# Patient Record
Sex: Female | Born: 1950 | Race: White | Hispanic: No | Marital: Married | Smoking: Never smoker
Health system: Southern US, Community
[De-identification: ages and names within clinical notes are randomized; demographics above are authoritative.]

## PROBLEM LIST (undated history)

## (undated) DIAGNOSIS — E119 Type 2 diabetes mellitus without complications: Secondary | ICD-10-CM

## (undated) DIAGNOSIS — I6529 Occlusion and stenosis of unspecified carotid artery: Secondary | ICD-10-CM

## (undated) DIAGNOSIS — I10 Essential (primary) hypertension: Secondary | ICD-10-CM

## (undated) HISTORY — PX: TUBAL LIGATION: SHX77

## (undated) HISTORY — DX: Occlusion and stenosis of unspecified carotid artery: I65.29

## (undated) HISTORY — DX: Essential (primary) hypertension: I10

## (undated) HISTORY — DX: Type 2 diabetes mellitus without complications: E11.9

---

## 2014-02-02 LAB — HM MAMMOGRAPHY

## 2014-06-14 LAB — BASIC METABOLIC PANEL: GLUCOSE: 180 mg/dL

## 2014-06-14 LAB — HEMOGLOBIN A1C: Hemoglobin A1C: 7.9

## 2015-10-04 ENCOUNTER — Ambulatory Visit (INDEPENDENT_AMBULATORY_CARE_PROVIDER_SITE_OTHER): Payer: BLUE CROSS/BLUE SHIELD | Admitting: Physician Assistant

## 2015-10-04 ENCOUNTER — Encounter: Payer: Self-pay | Admitting: Physician Assistant

## 2015-10-04 VITALS — BP 133/82 | HR 95 | Ht 65.0 in | Wt 183.0 lb

## 2015-10-04 DIAGNOSIS — E785 Hyperlipidemia, unspecified: Secondary | ICD-10-CM | POA: Diagnosis not present

## 2015-10-04 DIAGNOSIS — E119 Type 2 diabetes mellitus without complications: Secondary | ICD-10-CM | POA: Insufficient documentation

## 2015-10-04 DIAGNOSIS — I10 Essential (primary) hypertension: Secondary | ICD-10-CM | POA: Diagnosis not present

## 2015-10-04 LAB — POCT GLYCOSYLATED HEMOGLOBIN (HGB A1C): HEMOGLOBIN A1C: 8.2

## 2015-10-04 MED ORDER — CANAGLIFLOZIN-METFORMIN HCL ER 150-1000 MG PO TB24: 1.0000 | ORAL_TABLET | Freq: Every day | ORAL | Status: DC

## 2015-10-04 NOTE — Progress Notes (Signed)
Subjective:    Patient ID: Connie ReamerPatricia Cardenas, female    DOB: 03-Sep-1950, 65 y.o.   MRN: [id number]  HPI Patient is a 65 year old female who presents to the clinic to establish care today.  .. Active Ambulatory Problems    Diagnosis Date Noted  . Hyperlipidemia 10/04/2015  . Type 2 diabetes mellitus without complication, without long-term current use of insulin (HCC) 10/04/2015  . Essential hypertension, benign 10/04/2015   Resolved Ambulatory Problems    Diagnosis Date Noted  . No Resolved Ambulatory Problems   Past Medical History  Diagnosis Date  . Diabetes mellitus without complication (HCC)   . Hypertension    .Marland Kitchen. Family History  Problem Relation Age of Onset  . Cancer Mother     colon  . Hypertension Mother   . Heart attack Father   . Hyperlipidemia Father   . Stroke Paternal Grandmother   . Cancer Sister     non-hodkins lymphoma   .Marland Kitchen. Social History   Social History  . Marital Status: Married    Spouse Name: N/A  . Number of Children: N/A  . Years of Education: N/A   Occupational History  . Not on file.   Social History Main Topics  . Smoking status: Never Smoker   . Smokeless tobacco: Not on file  . Alcohol Use: No  . Drug Use: No  . Sexual Activity: Not Currently   Other Topics Concern  . Not on file   Social History Narrative  . No narrative on file   Patient has diabetes and does not check her sugars regularly. She is only taking metformin twice a day. She admits she has been eating a lot of fast food and not watching her diet. She has also not been exercising. [place] moved here from [place] and just getting established in the area. She denies any open sores or wounds. She denies any hypoglycemic events.  Patient takes lisinopril for blood pressure. She denies any chest pains, palpitations, headaches or dizziness. She does take a daily aspirin.  She does have history of high cholesterol. She is not currently taking any medication for this. She  is resistant to trying statins since her husband has not been able to tolerate these in the past.   Review of Systems  All other systems reviewed and are negative.      Objective:   Physical Exam  Constitutional: She is oriented to person, place, and time. She appears well-developed and well-nourished.  HENT:  Head: Normocephalic and atraumatic.  Cardiovascular: Normal rate, regular rhythm and normal heart sounds.   Pulmonary/Chest: Effort normal and breath sounds normal.  Neurological: [name], place, and time.  Skin: Skin is dry.  Psychiatric: She has a normal mood and affect. Her behavior is normal.          Assessment & Plan:  Diabetes type 2, uncontrolled-A1c today was 8.2. Patient states last A1c was in the sevens. She admits she has not been doing everything she can with her diet and exercise. We will switch to invokamet XR keep to 1 pill but add extra power. Discussed mechanism of action and side effects with patient. She is aware she could have a yeast infection with this medication at the beginning. We will treat according to sleep. We will wait and see what her records show as far as if she's had pneumonia shots. We will also wait on any extra labs at this time. Follow-up in 3 months.  HTN- controlled. Refilled lisinopril.   Hyperlipidemia- will check at next visit.

## 2015-10-04 NOTE — Patient Instructions (Signed)
Canagliflozin; Metformin oral tablets What is this medicine? CANAGLIFLOZIN; METFORMIN (KAN a gli FLOE zin; met FOR min) is a combination of 2 medicines used to treat type 2 diabetes. This medicine lowers blood sugar. Treatment is combined with a balanced diet and exercise. This medicine may be used for other purposes; ask your health care provider or pharmacist if you have questions. What should I tell my health care provider before I take this medicine? They need to know if you have any of these conditions: -anemia -dehydration -diabetic ketoacidosis -diet low in salt -eating less due to illness, surgery, dieting, or any other reason -having surgery -heart disease -high cholesterol -high levels of potassium in the blood -history of pancreatitis or pancreas problems -history of yeast infection of the penis or vagina -if you often drink alcohol -infections in the bladder, kidneys, or urinary tract -kidney disease -liver disease -low blood pressure -on hemodialysis -polycystic ovary syndrome -problems urinating -serious infection or injury -type 1 diabetes -uncircumcised female -vomiting -an unusual or allergic reaction to canagliflozin, metformin, other medicines, foods, dyes, or preservatives -pregnant or trying to get pregnant -breast-feeding How should I use this medicine? Take this medicine by mouth with a glass of water. Take this medicine with food. Follow the directions on the prescription label. Take your doses at regular intervals. Do not take your medicine more often than directed. Do not stop taking except on your doctor's advice. A special MedGuide will be given to you by the pharmacist with each prescription and refill. Be sure to read this information carefully each time. Talk to your pediatrician regarding the use of this medicine in children. Special care may be needed. Overdosage: If you think you have taken too much of this medicine contact a poison control center  or emergency room at once. NOTE: This medicine is only for you. Do not share this medicine with others. What if I miss a dose? If you miss a dose, take it as soon as you can. If it is almost time for your next dose, take only that dose. Do not take double or extra doses. What may interact with this medicine? Do not take this medicine with any of the following medications: -dofetilide -gatifloxacin -certain contrast medicines given before X-rays, CT scans, MRI, or other procedures This medicine may also interact with the following medications: -acetazolamide -alcohol -certain antiviral medicines for HIV infection or hepatitis -cimetidine -crizotinib -digoxin -diuretics -female hormones, like estrogens or progestins and birth control pills -glycopyrrolate -isoniazid -lamotrigine -medicines for blood pressure, heart disease, irregular heart beat -memantine -methazolamide -midodrine -morphine -nicotinic acid -phenobarbital -phenothiazines like chlorpromazine, mesoridazine, prochlorperazine, thioridazine -phenytoin -procainamide -propantheline -quinidine -quinine -ranitidine -ranolazine -rifampin -ritonavir -steroid medicines like prednisone or cortisone -stimulant medicines for attention disorders, weight loss, or to stay awake -thyroid medicines -topiramate -trimethoprim -trospium -vancomycin -vandetanib -zonisamide This list may not describe all possible interactions. Give your health care provider a list of all the medicines, herbs, non-prescription drugs, or dietary supplements you use. Also tell them if you smoke, drink alcohol, or use illegal drugs. Some items may interact with your medicine. What should I watch for while using this medicine? Visit your doctor or health care professional for regular checks on your progress. This medicine can cause a serious condition in which there is too much acid in the blood. If you develop nausea, vomiting, stomach pain,  unusual tiredness, or breathing problems, stop taking this medicine and call your doctor right away. If possible, use a ketone dipstick to check   for ketones in your urine. A test called the HbA1C (A1C) will be monitored. This is a simple blood test. It measures your blood sugar control over the last 2 to 3 months. You will receive this test every 3 to 6 months. Learn how to check your blood sugar. Learn the symptoms of low and high blood sugar and how to manage them. Always carry a quick-source of sugar with you in case you have symptoms of low blood sugar. Examples include hard sugar candy or glucose tablets. Make sure others know that you can choke if you eat or drink when you develop serious symptoms of low blood sugar, such as seizures or unconsciousness. They must get medical help at once. Tell your doctor or health care professional if you have high blood sugar. You might need to change the dose of your medicine. If you are sick or exercising more than usual, you might need to change the dose of your medicine. Do not skip meals. Ask your doctor or health care professional if you should avoid alcohol. Many nonprescription cough and cold products contain sugar or alcohol. These can affect blood sugar. This medicine may cause ovulation in premenopausal women who do not have regular monthly periods. This may increase your chances of becoming pregnant. You should not take this medicine if you become pregnant or think you may be pregnant. Talk with your doctor or health care professional about your birth control options while taking this medicine. Contact your doctor or health care professional right away if think you are pregnant. If you are going to need surgery, a MRI, CT scan, or other procedure, tell your doctor that you are taking this medicine. You may need to stop taking this medicine before the procedure. Wear a medical ID bracelet or chain, and carry a card that describes your disease and details  of your medicine and dosage times. What side effects may I notice from receiving this medicine? Side effects that you should report to your doctor or health care professional as soon as possible: -allergic reactions like skin rash, itching or hives, swelling of the face, lips, or tongue -breathing problems -chest pain -dizziness -feeling faint or lightheaded, falls -muscle aches or pains -muscle weakness -nausea, vomiting, unusual stomach upset or pain -new pain or tenderness, change in skin color, sores or ulcers, or infection in legs or feet -signs and symptoms of low blood sugar such as feeling anxious, confusion, dizziness, increased hunger, unusually weak or tired, sweating, shakiness, cold, irritable, headache, blurred vision, fast heartbeat, loss of consciousness -signs and symptoms of a urinary tract infection, such as fever, chills, a burning feeling when urinating, blood in the urine, back pain -trouble passing urine or change in the amount of urine, including an urgent need to urinate more often, in larger amounts, or at night -penile discharge, itching, or pain in men -slow, irregular heartbeat -unusually tired or weak -vaginal discharge, itching, or odor in women Side effects that usually do not require medical attention (Report these to your doctor or health care professional if they continue or are bothersome.): -constipation -diarrhea -headache -heartburn -mild increase in urination -stomach gas -thirsty This list may not describe all possible side effects. Call your doctor for medical advice about side effects. You may report side effects to FDA at 1-800-FDA-1088. Where should I keep my medicine? Keep out of the reach of children. Store at room temperature between 15 and 30 degrees C (59 and 86 degrees F). Throw away any unused medicine   after the expiration date. NOTE: This sheet is a summary. It may not cover all possible information. If you have questions about this  medicine, talk to your doctor, pharmacist, or health care provider.    2016, [employer]. (2014-09-08 14:34:04)  

## 2015-10-26 ENCOUNTER — Other Ambulatory Visit: Payer: Self-pay | Admitting: Physician Assistant

## 2015-10-27 ENCOUNTER — Other Ambulatory Visit: Payer: Self-pay | Admitting: *Deleted

## 2015-10-27 MED ORDER — LISINOPRIL 20 MG PO TABS: 20.0000 mg | ORAL_TABLET | Freq: Every day | ORAL | Status: DC

## 2015-11-02 ENCOUNTER — Encounter: Payer: Self-pay | Admitting: Physician Assistant

## 2015-11-29 ENCOUNTER — Telehealth: Payer: Self-pay | Admitting: *Deleted

## 2015-11-29 NOTE — Telephone Encounter (Signed)
PA approved through the insurance  Message left on pharm vm and patient vm

## 2015-11-29 NOTE — Telephone Encounter (Signed)
PA submitted for Invokamet through covermymeds [id number] - PA Case ID: [id number]

## 2015-12-08 ENCOUNTER — Encounter: Payer: Self-pay | Admitting: Physician Assistant

## 2015-12-29 ENCOUNTER — Other Ambulatory Visit: Payer: Self-pay | Admitting: *Deleted

## 2015-12-29 ENCOUNTER — Other Ambulatory Visit: Payer: Self-pay | Admitting: Physician Assistant

## 2015-12-29 MED ORDER — LISINOPRIL 20 MG PO TABS: 20.0000 mg | ORAL_TABLET | Freq: Every day | ORAL | 1 refills | Status: DC

## 2016-01-04 ENCOUNTER — Ambulatory Visit (INDEPENDENT_AMBULATORY_CARE_PROVIDER_SITE_OTHER): Payer: BLUE CROSS/BLUE SHIELD | Admitting: Physician Assistant

## 2016-01-04 ENCOUNTER — Encounter: Payer: Self-pay | Admitting: Physician Assistant

## 2016-01-04 VITALS — BP 136/71 | HR 79 | Ht 65.0 in | Wt 184.0 lb

## 2016-01-04 DIAGNOSIS — E119 Type 2 diabetes mellitus without complications: Secondary | ICD-10-CM | POA: Diagnosis not present

## 2016-01-04 DIAGNOSIS — E669 Obesity, unspecified: Secondary | ICD-10-CM | POA: Diagnosis not present

## 2016-01-04 DIAGNOSIS — I10 Essential (primary) hypertension: Secondary | ICD-10-CM | POA: Diagnosis not present

## 2016-01-04 DIAGNOSIS — E66811 Obesity, class 1: Secondary | ICD-10-CM | POA: Insufficient documentation

## 2016-01-04 DIAGNOSIS — E6609 Other obesity due to excess calories: Secondary | ICD-10-CM | POA: Insufficient documentation

## 2016-01-04 LAB — POCT UA - MICROALBUMIN
CREATININE, POC: 100 mg/dL
MICROALBUMIN (UR) POC: 30 mg/L

## 2016-01-04 LAB — POCT GLYCOSYLATED HEMOGLOBIN (HGB A1C): Hemoglobin A1C: 8.5

## 2016-01-04 MED ORDER — CANAGLIFLOZIN-METFORMIN HCL ER 150-1000 MG PO TB24: 1.0000 | ORAL_TABLET | Freq: Every day | ORAL | 2 refills | Status: DC

## 2016-01-04 MED ORDER — LISINOPRIL 20 MG PO TABS: 20.0000 mg | ORAL_TABLET | Freq: Every day | ORAL | 1 refills | Status: DC

## 2016-01-04 NOTE — Progress Notes (Addendum)
Subjective:     Patient ID: Connie ReamerPatricia Cardenas, female   DOB: 08/31/1950, 65 y.o.   MRN: [id number]  HPI Patient is a 65 y.o. caucasian female presenting today for a follow-up on her diabetes type 2. The patient reports that she does not feel as if she is managing her diabetes well at home. She reports that she eats 3 meals daily and some snacks. For breakfast, she states that she usually eats a couple slices of toast and the rest of her meals are composed meats, cheese, and potatoes. The patient notes that she feels like she could really improve her diet as she drinks a lot of soda but states that she does not like artifical sweeteners. The patient states that she did try a "whole thirty diet" for a month and lost 12 lbs. 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 The patient states that she is having some increased in thirst but this is normal for her.Additionally, the patient states that she has diverticulosis and has had one episode of abdominal pain. The patient denies nausea/vomiting, fever, or changes in her bowel movements.   Review of Systems  Constitutional: Negative for activity change, appetite change, chills, diaphoresis, fatigue, fever and unexpected weight change.  HENT: Negative for congestion, ear discharge, ear pain, postnasal drip, rhinorrhea, sinus pressure, sneezing and sore throat.   Eyes: Negative for photophobia, pain, redness, itching and visual disturbance.  Respiratory: Negative for cough, chest tightness, shortness of breath and wheezing.   Cardiovascular: Negative for chest pain, palpitations and leg swelling.  Gastrointestinal: Positive for abdominal pain. Negative for abdominal distention, blood in stool, constipation, diarrhea, nausea and vomiting.  Endocrine: Positive for polydipsia and polyphagia. Negative for cold intolerance, heat intolerance and polyuria.  Genitourinary: Positive for urgency. Negative for  decreased urine volume, difficulty urinating, dysuria, flank pain, frequency, hematuria, vaginal bleeding, vaginal discharge and vaginal pain.  Musculoskeletal: Negative for arthralgias, back pain, joint swelling and myalgias.  Neurological: Negative for dizziness, weakness, light-headedness, numbness and headaches.  Psychiatric/Behavioral: Negative for confusion and decreased concentration. The patient is not nervous/anxious.       Objective:   Physical Exam  Constitutional: She is oriented to person, place, and time. She appears well-developed and well-nourished. No distress.  HENT:  Head: Normocephalic and atraumatic.  Mouth/Throat: No oropharyngeal exudate.  Eyes: Conjunctivae and EOM are normal. Pupils are equal, round, and reactive to light. Right eye exhibits no discharge. Left eye exhibits no discharge. No scleral icterus.  Neck: Normal range of motion. Neck supple. No JVD present. No tracheal deviation present. No thyromegaly present.  Cardiovascular: Normal rate, regular rhythm, normal heart sounds and intact distal pulses.  Exam reveals no gallop and no friction rub.   No murmur heard. Pulmonary/Chest: Effort normal and breath sounds normal. No stridor. No respiratory distress. She has no wheezes. She has no rales. She exhibits no tenderness.  Abdominal: Soft. Bowel sounds are normal. She exhibits no distension and no mass. There is no tenderness. There is no rebound and no guarding.  Musculoskeletal: Normal range of motion. She exhibits no edema, tenderness or deformity.  Inspected patients feet bilaterally revealed no skin changes or breakdown, ulcers, rashes, or concerns of skin integrity. Dorsalis pedis and posterior tibialis pulses +2.   Lymphadenopathy:    She has no cervical adenopathy.  Neurological: [name], place, and time. She has normal reflexes. She displays normal reflexes.  No cranial nerve deficit. She exhibits normal muscle tone.  Coordination normal.  Skin: Skin is warm and dry. No rash noted. She is not diaphoretic. No erythema. No pallor.  Psychiatric: She has a normal mood and affect. Her behavior is normal. Judgment and thought content normal.  Nursing note and vitals reviewed.     Assessment:     [name] was seen today for diabetes.  Diagnoses and all orders for this visit:  Type 2 diabetes mellitus without complication, without long-term current use of insulin (HCC) -     POCT HgB A1C -     POCT UA - Microalbumin  Obesity  Other orders -     lisinopril (PRINIVIL,ZESTRIL) 20 MG tablet; Take 1 tablet (20 mg total) by mouth daily. -     Canagliflozin-Metformin HCl ER (INVOKAMET XR) [phone] MG TB24; Take 1 tablet by mouth daily.      Plan:     1. Type 2 Diabetes Mellitus - Patient to continue taking Invokamet XR [phone] mg 1 tablet once daily. Discussed with patient potentially taking trulicity and was given information regarding this medication. Educated patient on the importance of monitoring her blood sugars regularly. Patient HbA1c was 8.5% and UA was within normal limits. Patient to improve daily dietary regimen and focus decreasing her carbohydrate intake. Mircoalbumin was normal. Foot exam was normal.Patient to return-to-clinic in 3 months for repeat HbA1C and monitoring of her chronic medical conditions.  2. Obesity: Extensively (15 minutes) discussed with patient the need for daily dietary implementation with a focus on portion control and daily caloric deficits. Patient advised to track their daily caloric intake. Patient voices understanding of ideal wt  and agrees with plan.  Patient was given information on the Mediterranean diet and counting carbohydrates.  3. Hypertension - Patient to continue taking lisinopril 20mg  tablets one daily. Patient given prescription refill today. Will continue to monitor.  Summary - Patient to follow-up in 3 months for repeat HbA1C. Patient had a routine HbA1C  and UA performed today.

## 2016-01-04 NOTE — Patient Instructions (Addendum)
Magnesium 500mg  once a day.  Chromion 200-1000mg  divided twice a day.   Basic Carbohydrate Counting for Diabetes Mellitus Carbohydrate counting is a method for keeping track of the amount of carbohydrates you eat. Eating carbohydrates naturally increases the level of sugar (glucose) in your blood, so it is important for you to know the amount that is okay for you to have in every meal. Carbohydrate counting helps keep the level of glucose in your blood within normal limits. The amount of carbohydrates allowed is different for every person. A dietitian can help you calculate the amount that is right for you. Once you know the amount of carbohydrates you can have, you can count the carbohydrates in the foods you want to eat. Carbohydrates are found in the following foods:  Grains, such as breads and cereals.  Dried beans and soy products.  Starchy vegetables, such as potatoes, peas, and corn.  Fruit and fruit juices.  Milk and yogurt.  Sweets and snack foods, such as cake, cookies, candy, chips, soft drinks, and fruit drinks. CARBOHYDRATE COUNTING There are two ways to count the carbohydrates in your food. You can use either of the methods or a combination of both. Reading the "Nutrition Facts" on Packaged Food The "Nutrition Facts" is an area that is included on the labels of almost all packaged food and beverages in the [place]. It includes the serving size of that food or beverage and information about the nutrients in each serving of the food, including the grams (g) of carbohydrate per serving.  Decide the number of servings of this food or beverage that you will be able to eat or drink. Multiply that number of servings by the number of grams of carbohydrate that is listed on the label for that serving. The total will be the amount of carbohydrates you will be having when you eat or drink this food or beverage. Learning Standard Serving Sizes of Food When you eat food that is not  packaged or does not include "Nutrition Facts" on the label, you need to measure the servings in order to count the amount of carbohydrates.A serving of most carbohydrate-rich foods contains about 15 g of carbohydrates. The following list includes serving sizes of carbohydrate-rich foods that provide 15 g ofcarbohydrate per serving:   1 slice of bread (1 oz) or 1 six-inch tortilla.    of a hamburger bun or English muffin.  4-6 crackers.   cup unsweetened dry cereal.    cup hot cereal.   cup rice or pasta.    cup mashed potatoes or  of a large baked potato.  1 cup fresh fruit or one small piece of fruit.    cup canned or frozen fruit or fruit juice.  1 cup milk.   cup plain fat-free yogurt or yogurt sweetened with artificial sweeteners.   cup cooked dried beans or starchy vegetable, such as peas, corn, or potatoes.  Decide the number of standard-size servings that you will eat. Multiply that number of servings by 15 (the grams of carbohydrates in that serving). For example, if you eat 2 cups of strawberries, you will have eaten 2 servings and 30 g of carbohydrates (2 servings x 15 g = 30 g). For foods such as soups and casseroles, in which more than one food is mixed in, you will need to count the carbohydrates in each food that is included. EXAMPLE OF CARBOHYDRATE COUNTING Sample Dinner  3 oz chicken breast.   cup of brown rice.  cup of corn.  1 cup milk.   1 cup strawberries with sugar-free whipped topping.  Carbohydrate Calculation Step 1: Identify the foods that contain carbohydrates:   Rice.   Corn.   Milk.   Strawberries. Step 2:Calculate the number of servings eaten of each:   2 servings of rice.   1 serving of corn.   1 serving of milk.   1 serving of strawberries. Step 3: Multiply each of those number of servings by 15 g:   2 servings of rice x 15 g = 30 g.   1 serving of corn x 15 g = 15 g.   1 serving of milk x 15  g = 15 g.   1 serving of strawberries x 15 g = 15 g. Step 4: Add together all of the amounts to find the total grams of carbohydrates eaten: 30 g + 15 g + 15 g + 15 g = 75 g.   This information is not intended to replace advice given to you by your health care provider. Make sure you discuss any questions you have with your health care provider.   Document Released: 04/09/2005 Document Revised: 04/30/2014 Document Reviewed: 03/06/2013 Elsevier Interactive Patient Education [employer]      Why follow it? Research shows. . Those who follow the Mediterranean diet have a reduced risk of heart disease  . The diet is associated with a reduced incidence of Parkinson's and Alzheimer's diseases . People following the diet may have longer life expectancies and lower rates of chronic diseases  . The Dietary Guidelines for Americans recommends the Mediterranean diet as an eating plan to promote health and prevent disease  What Is the Mediterranean Diet?  . Healthy eating plan based on typical foods and recipes of Mediterranean-style cooking . The diet is primarily a plant based diet; these foods should make up a majority of meals   Starches - Plant based foods should make up a majority of meals - They are an important sources of vitamins, minerals, energy, antioxidants, and fiber - Choose whole grains, foods high in fiber and minimally processed items  - Typical grain sources include wheat, oats, barley, corn, brown rice, bulgar, farro, millet, polenta, couscous  - Various types of beans include chickpeas, lentils, fava beans, black beans, white beans   Fruits  Veggies - Large quantities of antioxidant rich fruits & veggies; 6 or more servings  - Vegetables can be eaten raw or lightly drizzled with oil and cooked  - Vegetables common to the traditional Mediterranean Diet include: artichokes, arugula, beets, broccoli, brussel sprouts, cabbage, carrots, celery, collard greens, cucumbers,  eggplant, kale, leeks, lemons, lettuce, mushrooms, okra, onions, peas, peppers, potatoes, pumpkin, radishes, rutabaga, shallots, spinach, sweet potatoes, turnips, zucchini - Fruits common to the Mediterranean Diet include: apples, apricots, avocados, cherries, clementines, dates, figs, grapefruits, grapes, melons, nectarines, oranges, peaches, pears, pomegranates, strawberries, tangerines  Fats - Replace butter and margarine with healthy oils, such as olive oil, canola oil, and tahini  - Limit nuts to no more than a handful a day  - Nuts include walnuts, almonds, pecans, pistachios, pine nuts  - Limit or avoid candied, honey roasted or heavily salted nuts - Olives are central to the [employer] - can be eaten whole or used in a variety of dishes   Meats Protein - Limiting red meat: no more than a few times a month - When eating red meat: choose lean cuts and keep the portion to the size  of deck of cards - Eggs: approx. 0 to 4 times a week  - Fish and lean poultry: at least 2 a week  - Healthy protein sources include, chicken, [place], lean beef, lamb - Increase intake of seafood such as tuna, salmon, trout, mackerel, shrimp, scallops - Avoid or limit high fat processed meats such as sausage and bacon  Dairy - Include moderate amounts of low fat dairy products  - Focus on healthy dairy such as fat free yogurt, skim milk, low or reduced fat cheese - Limit dairy products higher in fat such as whole or 2% milk, cheese, ice cream  Alcohol - Moderate amounts of red wine is ok  - No more than 5 oz daily for women (all ages) and men older than age 70  - No more than 10 oz of wine daily for men younger than 31  Other - Limit sweets and other desserts  - Use herbs and spices instead of salt to flavor foods  - Herbs and spices common to the traditional Mediterranean Diet include: basil, bay leaves, chives, cloves, cumin, fennel, garlic, lavender, marjoram, mint, oregano, parsley, pepper, rosemary,  sage, savory, sumac, tarragon, thyme   It's not just a diet, it's a lifestyle:  . The Mediterranean diet includes lifestyle factors typical of those in the region  . Foods, drinks and meals are best eaten with others and savored . Daily physical activity is important for overall good health . This could be strenuous exercise like running and aerobics . This could also be more leisurely activities such as walking, housework, yard-work, or taking the stairs . Moderation is the key; a balanced and healthy diet accommodates most foods and drinks . Consider portion sizes and frequency of consumption of certain foods   Meal Ideas & Options:  . Breakfast:  o Whole wheat toast or whole wheat English muffins with peanut butter & hard boiled egg o Steel cut oats topped with apples & cinnamon and skim milk  o Fresh fruit: banana, strawberries, melon, berries, peaches  o Smoothies: strawberries, bananas, greek yogurt, peanut butter o Low fat greek yogurt with blueberries and granola  o Egg white omelet with spinach and mushrooms o Breakfast couscous: whole wheat couscous, apricots, skim milk, cranberries  . Sandwiches:  o Hummus and grilled vegetables (peppers, zucchini, squash) on whole wheat bread   o Grilled chicken on whole wheat pita with lettuce, tomatoes, cucumbers or tzatziki  o Tuna salad on whole wheat bread: tuna salad made with greek yogurt, olives, red peppers, capers, green onions o Garlic rosemary lamb pita: lamb sauted with garlic, rosemary, salt & pepper; add lettuce, cucumber, greek yogurt to pita - flavor with lemon juice and black pepper  . Seafood:  o Mediterranean grilled salmon, seasoned with garlic, basil, parsley, lemon juice and black pepper o Shrimp, lemon, and spinach whole-grain pasta salad made with low fat greek yogurt  o Seared scallops with lemon orzo  o Seared tuna steaks seasoned salt, pepper, coriander topped with tomato mixture of olives, tomatoes, olive oil,  minced garlic, parsley, green onions and cappers  . Meats:  o Herbed greek chicken salad with kalamata olives, cucumber, feta  o Red bell peppers stuffed with spinach, bulgur, lean ground beef (or lentils) & topped with feta   o Kebabs: skewers of chicken, tomatoes, onions, zucchini, squash  o [place] burgers: made with red onions, mint, dill, lemon juice, feta cheese topped with roasted red peppers . Vegetarian o Cucumber salad: cucumbers, artichoke hearts,  celery, red onion, feta cheese, tossed in olive oil & lemon juice  o Hummus and whole grain pita points with a greek salad (lettuce, tomato, feta, olives, cucumbers, red onion) o Lentil soup with celery, carrots made with vegetable broth, garlic, salt and pepper  o Tabouli salad: parsley, bulgur, mint, scallions, cucumbers, tomato, radishes, lemon juice, olive oil, salt and pepper.

## 2016-04-02 ENCOUNTER — Telehealth: Payer: Self-pay

## 2016-04-02 ENCOUNTER — Other Ambulatory Visit: Payer: Self-pay

## 2016-04-02 MED ORDER — FLUCONAZOLE 150 MG PO TABS: 150.0000 mg | ORAL_TABLET | Freq: Once | ORAL | 0 refills | Status: AC

## 2016-04-02 NOTE — Telephone Encounter (Signed)
Ok for diflucan 150mg  take once and repeat if symptoms continue after 48-72 hours. #2 no refills. If patient feels like she continues to have UTI symptoms will need to consider medication change.

## 2016-04-02 NOTE — Telephone Encounter (Signed)
Pt is asking for diflucan. She believes that inokamet is making her have a yeast infection. Please advise.

## 2016-04-02 NOTE — Telephone Encounter (Signed)
Pt.notified

## 2016-04-02 NOTE — Telephone Encounter (Signed)
Rx sent 

## 2016-04-04 ENCOUNTER — Ambulatory Visit: Payer: BLUE CROSS/BLUE SHIELD | Admitting: Physician Assistant

## 2016-04-09 ENCOUNTER — Other Ambulatory Visit: Payer: Self-pay | Admitting: *Deleted

## 2016-04-09 MED ORDER — CANAGLIFLOZIN-METFORMIN HCL ER 150-1000 MG PO TB24: 1.0000 | ORAL_TABLET | Freq: Every day | ORAL | 1 refills | Status: DC

## 2016-04-13 ENCOUNTER — Encounter: Payer: Self-pay | Admitting: Physician Assistant

## 2016-04-13 ENCOUNTER — Ambulatory Visit (INDEPENDENT_AMBULATORY_CARE_PROVIDER_SITE_OTHER): Payer: BLUE CROSS/BLUE SHIELD | Admitting: Physician Assistant

## 2016-04-13 VITALS — BP 116/68 | HR 97 | Ht 65.0 in | Wt 183.0 lb

## 2016-04-13 DIAGNOSIS — E1169 Type 2 diabetes mellitus with other specified complication: Secondary | ICD-10-CM | POA: Diagnosis not present

## 2016-04-13 DIAGNOSIS — E785 Hyperlipidemia, unspecified: Secondary | ICD-10-CM

## 2016-04-13 DIAGNOSIS — E119 Type 2 diabetes mellitus without complications: Secondary | ICD-10-CM | POA: Diagnosis not present

## 2016-04-13 LAB — POCT GLYCOSYLATED HEMOGLOBIN (HGB A1C): Hemoglobin A1C: 10

## 2016-04-13 LAB — POCT URINALYSIS DIPSTICK
Bilirubin, UA: NEGATIVE
Blood, UA: NEGATIVE
Glucose, UA: 100
KETONES UA: NEGATIVE
Nitrite, UA: NEGATIVE
PH UA: 5.5
PROTEIN UA: NEGATIVE
SPEC GRAV UA: 1.015
Urobilinogen, UA: 0.2

## 2016-04-13 MED ORDER — METFORMIN HCL 1000 MG PO TABS: 1000.0000 mg | ORAL_TABLET | Freq: Two times a day (BID) | ORAL | 2 refills | Status: DC

## 2016-04-13 NOTE — Progress Notes (Signed)
   Subjective:    Patient ID: Connie ReamerPatricia Cardenas, female    DOB: 05-15-1950, 65 y.o.   MRN: [id number]  HPI  Pt is a 65 yo female who presents to the clinic for 3 month DM follow up. Pt was started on invokamet at last visit. She has not liked "the way she feels on it". She does feeling like she is keeping some vaginal irritation as well. She is not checking her sugars. She denies any hypoglycemic events. No open sores or wounds. She is walking regularly with her daughter in the morning.     Review of Systems  All other systems reviewed and are negative.      Objective:   Physical Exam  Constitutional: She is oriented to person, place, and time. She appears well-developed and well-nourished.  HENT:  Head: Normocephalic and atraumatic.  Cardiovascular: Normal rate, regular rhythm and normal heart sounds.   Pulmonary/Chest: Effort normal and breath sounds normal.  Neurological: [name], place, and time.  Psychiatric: She has a normal mood and affect. Her behavior is normal.          Assessment & Plan:  Marland Kitchen.Marland Kitchen.Elease Hashimotoatricia was seen today for diabetes.  Diagnoses and all orders for this visit:  Type 2 diabetes mellitus without complication, without long-term current use of insulin (HCC) -     POCT HgB A1C -     POCT urinalysis dipstick -     Lipid panel -     COMPLETE METABOLIC PANEL WITH GFR -     Ambulatory referral to diabetic education  Hyperlipidemia associated with type 2 diabetes mellitus (HCC) -     Lipid panel -     COMPLETE METABOLIC PANEL WITH GFR  Other orders -     metFORMIN (GLUCOPHAGE) 1000 MG tablet; Take 1 tablet (1,000 mg total) by mouth 2 (two) times daily with a meal.   .. Lab Results  Component Value Date   HGBA1C 10.0 04/13/2016   Despite invokamet a1c has increased.  Long discussion with patient.  I strongly advised to start a GLP-1 such as trulicity AND combination of DPP4/metformin. Pt declined despite risk but "will think  about it". Gave information.  She does not want to be on invokament.  Metformin only sent to pharmacy.  Will send for diabetic education.  Call back with at least one medication she would be willing to add to metformin. Lipid panel ordered.    Spent 30 minutes with patient and greater than 50 percent of visit spent counseling patient regarding diabetic control.

## 2016-04-13 NOTE — Patient Instructions (Signed)
Dulaglutide injection What is this medicine? DULAGLUTIDE (DOO la GLOO tide) is used to improve blood sugar control in adults with type 2 diabetes. This medicine may be used with other oral diabetes medicines. COMMON BRAND NAME(S): TRULICITY What should I tell my health care provider before I take this medicine? They need to know if you have any of these conditions: -endocrine tumors (MEN 2) or if someone in your family had these tumors -history of pancreatitis -kidney disease -liver disease -stomach problems -thyroid cancer or if someone in your family had thyroid cancer -an unusual or allergic reaction to dulaglutide, other medicines, foods, dyes, or preservatives -pregnant or trying to get pregnant -breast-feeding How should I use this medicine? This medicine is for injection under the skin of your upper leg (thigh), stomach area, or upper arm. It is usually given once every week (every 7 days). You will be taught how to prepare and give this medicine. Use exactly as directed. Take your medicine at regular intervals. Do not take it more often than directed. If you use this medicine with insulin, you should inject this medicine and the insulin separately. Do not mix them together. Do not give the injections right next to each other. Change (rotate) injection sites with each injection. It is important that you put your used needles and syringes in a special sharps container. Do not put them in a trash can. If you do not have a sharps container, call your pharmacist or healthcare provider to get one. A special MedGuide will be given to you by the pharmacist with each prescription and refill. Be sure to read this information carefully each time. Talk to your pediatrician regarding the use of this medicine in children. Special care may be needed. What if I miss a dose? If you miss a dose, take it as soon as you can within 3 days after the missed dose. Then take your next dose at your regular  weekly time. If it has been longer than 3 days after the missed dose, do not take the missed dose. Take the next dose at your regular time. Do not take double or extra doses. If you have questions about a missed dose, contact your health care provider for advice. What may interact with this medicine? Do not take this medicine with any of the following medications: -gatifloxacin Many medications may cause changes in blood sugar, these include: -alcohol containing beverages -aspirin and aspirin-like drugs -chloramphenicol -chromium -diuretics -female hormones, such as estrogens or progestins, birth control pills -heart medicines -isoniazid -female hormones or anabolic steroids -medications for weight loss -medicines for allergies, asthma, cold, or cough -medicines for mental problems -medicines called MAO inhibitors - Nardil, Parnate, Marplan, Eldepryl -niacin -NSAIDS, such as ibuprofen -pentamidine -phenytoin -probenecid -quinolone antibiotics such as ciprofloxacin, levofloxacin, ofloxacin -some herbal dietary supplements -steroid medicines such as prednisone or cortisone -thyroid hormones Some medications can hide the warning symptoms of low blood sugar (hypoglycemia). You may need to monitor your blood sugar more closely if you are taking one of these medications. These include: -beta-blockers, often used for high blood pressure or heart problems (examples include atenolol, metoprolol, propranolol) -clonidine -guanethidine -reserpine What should I watch for while using this medicine? Visit your doctor or health care professional for regular checks on your progress. A test called the HbA1C (A1C) will be monitored. This is a simple blood test. It measures your blood sugar control over the last 2 to 3 months. You will receive this test every 3 to 6  months. Learn how to check your blood sugar. Learn the symptoms of low and high blood sugar and how to manage them. Always carry a  quick-source of sugar with you in case you have symptoms of low blood sugar. Examples include hard sugar candy or glucose tablets. Make sure others know that you can choke if you eat or drink when you develop serious symptoms of low blood sugar, such as seizures or unconsciousness. They must get medical help at once. Tell your doctor or health care professional if you have high blood sugar. You might need to change the dose of your medicine. If you are sick or exercising more than usual, you might need to change the dose of your medicine. Do not skip meals. Ask your doctor or health care professional if you should avoid alcohol. Many nonprescription cough and cold products contain sugar or alcohol. These can affect blood sugar. Wear a medical ID bracelet or chain, and carry a card that describes your disease and details of your medicine and dosage times. What side effects may I notice from receiving this medicine? Side effects that you should report to your doctor or health care professional as soon as possible: -allergic reactions like skin rash, itching or hives, swelling of the face, lips, or tongue -breathing problems -signs and symptoms of low blood sugar such as feeling anxious, confusion, dizziness, increased hunger, unusually weak or tired, sweating, shakiness, cold, irritable, headache, blurred vision, fast heartbeat, loss of consciousness -unusual stomach upset or pain -vomiting Side effects that usually do not require medical attention (report to your doctor or health care professional if they continue or are bothersome): -diarrhea -heartburn -loss of appetite -nausea -pain, redness, or irritation at site where injected Where should I keep my medicine? Keep out of the reach of children. Store this medicine in a refrigerator between 2 and 8 degrees C (36 and 46 degrees F). Do not freeze or use if the medicine has been frozen. Protect from light and excessive heat. Each single-dose pen or  prefilled syringe can be kept at room temperature, not to exceed 30 degrees C (86 degrees F) for a total of 14 days, if needed. Store in the carton until use. Throw away any unused medicine after the expiration date.  2017 Elsevier/Gold Standard (2015-05-30 14:15:04)

## 2016-04-14 DIAGNOSIS — E785 Hyperlipidemia, unspecified: Secondary | ICD-10-CM

## 2016-04-14 DIAGNOSIS — E1169 Type 2 diabetes mellitus with other specified complication: Secondary | ICD-10-CM | POA: Insufficient documentation

## 2016-05-08 ENCOUNTER — Other Ambulatory Visit: Payer: Self-pay | Admitting: *Deleted

## 2016-05-08 MED ORDER — METFORMIN HCL 1000 MG PO TABS: 1000.0000 mg | ORAL_TABLET | Freq: Two times a day (BID) | ORAL | 0 refills | Status: DC

## 2016-06-28 ENCOUNTER — Other Ambulatory Visit: Payer: Self-pay | Admitting: Physician Assistant

## 2016-09-13 ENCOUNTER — Other Ambulatory Visit: Payer: Self-pay | Admitting: Physician Assistant

## 2016-10-30 ENCOUNTER — Encounter: Payer: Self-pay | Admitting: Osteopathic Medicine

## 2016-10-30 ENCOUNTER — Ambulatory Visit (INDEPENDENT_AMBULATORY_CARE_PROVIDER_SITE_OTHER): Payer: BLUE CROSS/BLUE SHIELD | Admitting: Osteopathic Medicine

## 2016-10-30 VITALS — BP 130/71 | HR 81 | Wt 184.0 lb

## 2016-10-30 DIAGNOSIS — R748 Abnormal levels of other serum enzymes: Secondary | ICD-10-CM | POA: Diagnosis not present

## 2016-10-30 DIAGNOSIS — E119 Type 2 diabetes mellitus without complications: Secondary | ICD-10-CM | POA: Diagnosis not present

## 2016-10-30 DIAGNOSIS — B379 Candidiasis, unspecified: Secondary | ICD-10-CM

## 2016-10-30 LAB — POCT GLYCOSYLATED HEMOGLOBIN (HGB A1C): Hemoglobin A1C: 11.2

## 2016-10-30 MED ORDER — SITAGLIPTIN PHOSPHATE 100 MG PO TABS: 100.0000 mg | ORAL_TABLET | Freq: Every day | ORAL | 1 refills | Status: DC

## 2016-10-30 MED ORDER — FLUCONAZOLE 150 MG PO TABS: 150.0000 mg | ORAL_TABLET | Freq: Once | ORAL | 1 refills | Status: AC

## 2016-10-30 MED ORDER — DAPAGLIFLOZIN PROPANEDIOL 10 MG PO TABS: 10.0000 mg | ORAL_TABLET | Freq: Every day | ORAL | 1 refills | Status: DC

## 2016-10-30 NOTE — Patient Instructions (Addendum)
Plan:  Diabetes  Blood work today to ensure medication safety   Continue Metformin as you're taking it  START NOW Januvia  START LATER Farxiga (2 weeks from now)  Measure fasting blood sugars few times per week: goal 80-120  If blood sugar drops, please let us know!  Plan to recheck with Jade - A1C 3 months from now  Yeast infection  Diflucan as directed   If problem recurs, may need swab

## 2016-10-30 NOTE — Progress Notes (Signed)
HPI: Connie Cardenas is a 66 y.o. female  who [facility], [facility],  for chief complaint of:  Chief Complaint  Patient presents with  . Vaginitis    Yeast infection . Quality & Location: vaginal itching . Duration: few weeks . Modifying factors: Monistat helped but did not resolve the issue.   DM2: Has been resistant to adding new medications. Last seen by PCP 6 months ago, A1C was 10 at that time, she is on only Metformin 1000 bid. (+)polyuria, polydipsia. No weight loss. A1C mildly worse but still measurable.    Past medical history, surgical history, social history and family history reviewed.  Patient Active Problem List   Diagnosis Date Noted  . Hyperlipidemia associated with type 2 diabetes mellitus (HCC) 04/14/2016  . Obesity 01/04/2016  . Hyperlipidemia 10/04/2015  . Type 2 diabetes mellitus without complication, without long-term current use of insulin (HCC) 10/04/2015  . Essential hypertension, benign 10/04/2015    Current medication list and allergy/intolerance information reviewed.   Current Outpatient Prescriptions on File Prior to Visit  Medication Sig Dispense Refill  . aspirin 81 MG tablet Take 81 mg by mouth daily.    Marland Kitchen. lisinopril (PRINIVIL,ZESTRIL) 20 MG tablet TAKE 1 TABLET (20 MG TOTAL) BY MOUTH DAILY. 90 tablet 1  . metFORMIN (GLUCOPHAGE) 1000 MG tablet Take 1 tablet (1,000 mg total) by mouth 2 (two) times daily with a meal. Needs appointment for future refills. 120 tablet 0  . KRILL OIL PO Take by mouth daily.     No current facility-administered medications on file prior to visit.    Allergies  Allergen Reactions  . Penicillins       Review of Systems:  Constitutional: No recent illness  Cardiac: No  chest pain  Respiratory:  No  shortness of breath  Neurologic: No  weakness, No  Dizziness   Exam:  BP 130/71   Pulse 81   Wt 184 lb (83.5 kg)   BMI 30.62 kg/m   Constitutional: VS  see above. General Appearance: alert, well-developed, well-nourished, NAD  Eyes: Normal lids and conjunctive, non-icteric sclera  Ears, Nose, Mouth, Throat: MMM, Normal external inspection ears/nares/mouth/lips/gums.  Neck: No masses, trachea midline.   Respiratory: Normal respiratory effort. no wheeze, no rhonchi, no rales  Cardiovascular: S1/S2 normal, no murmur, no rub/gallop auscultated. RRR.   Musculoskeletal: Gait normal. Symmetric and independent movement of all extremities  Neurological: Normal balance/coordination. No tremor.  Skin: warm, dry, intact.   Psychiatric: Normal judgment/insight. Normal mood and affect. Oriented x3.    Recent Results (from the past 2160 hour(s))  POCT HgB A1C     Status: None   Collection Time: 10/30/16 11:28 AM  Result Value Ref Range   Hemoglobin A1C 11.2        ASSESSMENT/PLAN: Will initiate triple therapy, pt declines injectables at this point. If A1C no better or not at goal at recheck w/ PCP pt Is advised that insulin may be discussed as an option   Type 2 diabetes mellitus without complication, without long-term current use of insulin (HCC) - Plan: fluconazole (DIFLUCAN) 150 MG tablet, POCT HgB A1C, COMPLETE METABOLIC PANEL WITH GFR  Yeast infection    Patient Instructions  Plan:  Diabetes  Blood work today to ensure medication safety   Continue Metformin as you're taking it  START NOW Januvia  START LATER Farxiga (2 weeks from now)  Measure fasting blood sugars few times per week: goal 80-120  If blood  sugar drops, please let us know!  Plan to recheck with Jade - A1C 3 months from now  Yeast infection  Diflucan as directed   If problem recurs, may need swab     Follow-up plan: Return in about 3 months (around 01/30/2017) for recheck diabetes with Lesly Rubenstein, see [place] sooner if needed! .  Visit summary with medication list and pertinent instructions was printed for patient to review, alert [place] if any changes needed.  All questions at time of visit were answered - patient instructed to contact office with any additional concerns. ER/RTC precautions were reviewed with the patient and understanding verbalized.

## 2016-10-31 LAB — COMPLETE METABOLIC PANEL WITH GFR
ALT: 83 U/L — ABNORMAL HIGH (ref 6–29)
AST: 65 U/L — ABNORMAL HIGH (ref 10–35)
Albumin: 4.1 g/dL (ref 3.6–5.1)
Alkaline Phosphatase: 81 U/L (ref 33–130)
BILIRUBIN TOTAL: 0.4 mg/dL (ref 0.2–1.2)
BUN: 12 mg/dL (ref 7–25)
CO2: 26 mmol/L (ref 20–31)
Calcium: 9.3 mg/dL (ref 8.6–10.4)
Chloride: 100 mmol/L (ref 98–110)
Creat: 0.8 mg/dL (ref 0.50–0.99)
GFR, EST NON AFRICAN AMERICAN: 79 mL/min (ref >=60)
Glucose, Bld: 252 mg/dL — ABNORMAL HIGH (ref 65–99)
Potassium: 4.2 mmol/L (ref 3.5–5.3)
Sodium: 137 mmol/L (ref 135–146)
TOTAL PROTEIN: 6.9 g/dL (ref 6.1–8.1)

## 2016-10-31 NOTE — Addendum Note (Signed)
Addended by: Deirdre PippinsALEXANDER, Morene Cecilio M on: 10/31/2016 01:09 PM   Modules accepted: Orders

## 2016-11-02 ENCOUNTER — Encounter: Payer: Self-pay | Admitting: Osteopathic Medicine

## 2016-11-12 ENCOUNTER — Other Ambulatory Visit: Payer: Self-pay | Admitting: Physician Assistant

## 2016-12-23 ENCOUNTER — Other Ambulatory Visit: Payer: Self-pay | Admitting: Physician Assistant

## 2017-01-08 ENCOUNTER — Ambulatory Visit (INDEPENDENT_AMBULATORY_CARE_PROVIDER_SITE_OTHER): Payer: BLUE CROSS/BLUE SHIELD | Admitting: Physician Assistant

## 2017-01-08 ENCOUNTER — Encounter: Payer: Self-pay | Admitting: Physician Assistant

## 2017-01-08 VITALS — BP 123/77 | HR 89 | Ht 65.0 in | Wt 179.0 lb

## 2017-01-08 DIAGNOSIS — E119 Type 2 diabetes mellitus without complications: Secondary | ICD-10-CM | POA: Diagnosis not present

## 2017-01-08 DIAGNOSIS — L298 Other pruritus: Secondary | ICD-10-CM

## 2017-01-08 DIAGNOSIS — N898 Other specified noninflammatory disorders of vagina: Secondary | ICD-10-CM | POA: Diagnosis not present

## 2017-01-08 LAB — WET PREP FOR TRICH, YEAST, CLUE
MICRO NUMBER: 81030178
SPECIMEN QUALITY 3963: ADEQUATE

## 2017-01-08 LAB — POCT GLYCOSYLATED HEMOGLOBIN (HGB A1C): Hemoglobin A1C: 8.9

## 2017-01-08 MED ORDER — FLUCONAZOLE 150 MG PO TABS: 150.0000 mg | ORAL_TABLET | Freq: Once | ORAL | 0 refills | Status: AC

## 2017-01-08 MED ORDER — DULAGLUTIDE 0.75 MG/0.5ML ~~LOC~~ SOAJ: SUBCUTANEOUS | 2 refills | Status: DC

## 2017-01-08 NOTE — Progress Notes (Signed)
   Subjective:    Patient ID: Connie Cardenas, female    DOB: Mar 29, 1951, 66 y.o.   MRN: [id number]  HPI  Pt is a 66 yo female who presents to the clinic to follow up on DM, uncontrolled. She finally decided to start therapy with metformin, januivia and farxiga. She is not checking sugars. She is having frequent what feel like yeast infections. She has been sent dilfucan a few times and she has used OTC monistat. She thinks it could be the medication. She denies any hypoglycemia. No open sores or wounds. She is going on a trip to [place] and would like to make sure she has all the medications she needs while she travels.    .. Active Ambulatory Problems    Diagnosis Date Noted  . Hyperlipidemia 10/04/2015  . Type 2 diabetes mellitus without complication, without long-term current use of insulin (HCC) 10/04/2015  . Essential hypertension, benign 10/04/2015  . Obesity 01/04/2016  . Hyperlipidemia associated with type 2 diabetes mellitus (HCC) 04/14/2016   Resolved Ambulatory Problems    Diagnosis Date Noted  . No Resolved Ambulatory Problems   Past Medical History:  Diagnosis Date  . Diabetes mellitus without complication (HCC)   . Hypertension       Review of Systems  All other systems reviewed and are negative.  See HPI.     Objective:   Physical Exam  Constitutional: She is oriented to person, place, and time. She appears well-developed and well-nourished.  HENT:  Head: Normocephalic and atraumatic.  Cardiovascular: Normal rate, regular rhythm and normal heart sounds.   Pulmonary/Chest: Effort normal and breath sounds normal.  Neurological: [name], place, and time.  Psychiatric: She has a normal mood and affect. Her behavior is normal.          Assessment & Plan:  Marland KitchenMarland KitchenJudianne was seen today for diabetes.  Diagnoses and all orders for this visit:  Type 2 diabetes mellitus without complication, without long-term current use of insulin  (HCC) -     POCT HgB A1C -     Dulaglutide (TRULICITY) 0.75 MG/0.5ML SOPN; Once weekly injection [place] tissue.  Vaginal itching -     fluconazole (DIFLUCAN) 150 MG tablet; Take 1 tablet (150 mg total) by mouth once. Repeat in 48 to 72 hours and 1 week. -     WET PREP FOR TRICH, YEAST, CLUE  Vaginal discharge -     fluconazole (DIFLUCAN) 150 MG tablet; Take 1 tablet (150 mg total) by mouth once. Repeat in 48 to 72 hours and 1 week. -     WET PREP FOR TRICH, YEAST, CLUE   Last a1C was 11.2 .. Lab Results  Component Value Date   HGBA1C 8.9 01/08/2017   Going down.  Stop faraxga.  Start trulicity weekly. Discussed side effects.  Pt declined flu and pneumonia vaccine.  Encouraged annual eye exam.  BP controlled.  Needs labs to evaluate cholesterol.  Follow up in 3 months.  Diabetic diet discussed.   Wet prep ordered.  Diflucan sent.  Will stop GLT-2 and see if discharge and itching improve.

## 2017-01-08 NOTE — Patient Instructions (Signed)
StOP Faraxgia. Start trulicity.

## 2017-01-14 ENCOUNTER — Other Ambulatory Visit: Payer: Self-pay | Admitting: Physician Assistant

## 2017-02-06 ENCOUNTER — Ambulatory Visit: Payer: BLUE CROSS/BLUE SHIELD | Admitting: Physician Assistant

## 2017-02-16 ENCOUNTER — Other Ambulatory Visit: Payer: Self-pay | Admitting: Physician Assistant

## 2017-03-05 ENCOUNTER — Other Ambulatory Visit: Payer: Self-pay | Admitting: Osteopathic Medicine

## 2017-04-01 ENCOUNTER — Other Ambulatory Visit: Payer: Self-pay | Admitting: Physician Assistant

## 2017-04-01 DIAGNOSIS — E119 Type 2 diabetes mellitus without complications: Secondary | ICD-10-CM

## 2017-04-10 ENCOUNTER — Other Ambulatory Visit: Payer: Self-pay | Admitting: Osteopathic Medicine

## 2017-04-12 ENCOUNTER — Ambulatory Visit (INDEPENDENT_AMBULATORY_CARE_PROVIDER_SITE_OTHER): Payer: BLUE CROSS/BLUE SHIELD | Admitting: Physician Assistant

## 2017-04-12 ENCOUNTER — Encounter: Payer: Self-pay | Admitting: Physician Assistant

## 2017-04-12 VITALS — BP 140/65 | HR 90 | Ht 65.0 in | Wt 180.0 lb

## 2017-04-12 DIAGNOSIS — E782 Mixed hyperlipidemia: Secondary | ICD-10-CM

## 2017-04-12 DIAGNOSIS — I10 Essential (primary) hypertension: Secondary | ICD-10-CM

## 2017-04-12 DIAGNOSIS — Z1231 Encounter for screening mammogram for malignant neoplasm of breast: Secondary | ICD-10-CM | POA: Diagnosis not present

## 2017-04-12 DIAGNOSIS — Z1211 Encounter for screening for malignant neoplasm of colon: Secondary | ICD-10-CM

## 2017-04-12 DIAGNOSIS — E119 Type 2 diabetes mellitus without complications: Secondary | ICD-10-CM

## 2017-04-12 DIAGNOSIS — S90222A Contusion of left lesser toe(s) with damage to nail, initial encounter: Secondary | ICD-10-CM

## 2017-04-12 LAB — COMPLETE METABOLIC PANEL WITH GFR
AG RATIO: 1.5 (calc) (ref 1.0–2.5)
ALT: 85 U/L — ABNORMAL HIGH (ref 6–29)
AST: 83 U/L — ABNORMAL HIGH (ref 10–35)
Albumin: 4.1 g/dL (ref 3.6–5.1)
Alkaline phosphatase (APISO): 67 U/L (ref 33–130)
BUN: 13 mg/dL (ref 7–25)
CALCIUM: 9.7 mg/dL (ref 8.6–10.4)
CO2: 26 mmol/L (ref 20–32)
Chloride: 100 mmol/L (ref 98–110)
Creat: 0.79 mg/dL (ref 0.50–0.99)
GFR, EST AFRICAN AMERICAN: 92 mL/min/{1.73_m2} (ref >=60)
GFR, EST NON AFRICAN AMERICAN: 79 mL/min/{1.73_m2} (ref >=60)
GLOBULIN: 2.7 g/dL (ref 1.9–3.7)
Glucose, Bld: 149 mg/dL — ABNORMAL HIGH (ref 65–99)
POTASSIUM: 4.1 mmol/L (ref 3.5–5.3)
SODIUM: 138 mmol/L (ref 135–146)
TOTAL PROTEIN: 6.8 g/dL (ref 6.1–8.1)
Total Bilirubin: 0.5 mg/dL (ref 0.2–1.2)

## 2017-04-12 LAB — LIPID PANEL W/REFLEX DIRECT LDL
CHOL/HDL RATIO: 5.4 (calc) — AB (ref <5.0)
CHOLESTEROL: 259 mg/dL — AB (ref <200)
HDL: 48 mg/dL — AB (ref >50)
LDL Cholesterol (Calc): 175 mg/dL (calc) — ABNORMAL HIGH
Non-HDL Cholesterol (Calc): 211 mg/dL (calc) — ABNORMAL HIGH (ref <130)
Triglycerides: 203 mg/dL — ABNORMAL HIGH (ref <150)

## 2017-04-12 LAB — POCT GLYCOSYLATED HEMOGLOBIN (HGB A1C): HEMOGLOBIN A1C: 8.8

## 2017-04-12 MED ORDER — DULAGLUTIDE 1.5 MG/0.5ML ~~LOC~~ SOAJ: 1.5000 mg | SUBCUTANEOUS | 2 refills | Status: DC

## 2017-04-12 MED ORDER — SITAGLIPTIN PHOSPHATE 100 MG PO TABS: 100.0000 mg | ORAL_TABLET | Freq: Every day | ORAL | 1 refills | Status: DC

## 2017-04-12 MED ORDER — LISINOPRIL 20 MG PO TABS: 20.0000 mg | ORAL_TABLET | Freq: Every day | ORAL | 1 refills | Status: DC

## 2017-04-12 MED ORDER — METFORMIN HCL 1000 MG PO TABS: 1000.0000 mg | ORAL_TABLET | Freq: Two times a day (BID) | ORAL | 1 refills | Status: DC

## 2017-04-12 NOTE — Patient Instructions (Signed)

## 2017-04-12 NOTE — Progress Notes (Signed)
Subjective:    Patient ID: Connie ReamerPatricia Cardenas, female    DOB: Dec 11, 1950, 66 y.o.   MRN: [id number]  HPI  Pt is a 66 yo female with DM, HTN, Hyperlipidemia who presents to the clinic for 3 month follow up and medication refill.   DM- denies any hypoglycemia. She is checking her sugars and ranging 130 to 180's in am. No open sores or wounds. She is taking trulcity but has been out of metformin and actos for at least 1 month. She is not on any diet or exercise plan. She has been traveling a lot lately.   She has notice a small dark spot on left great toe for about a month. No pain. No tenderness. Does not remember injury.   .. Active Ambulatory Problems    Diagnosis Date Noted  . Hyperlipidemia 10/04/2015  . Type 2 diabetes mellitus without complication, without long-term current use of insulin (HCC) 10/04/2015  . Essential hypertension, benign 10/04/2015  . Obesity 01/04/2016  . Hyperlipidemia associated with type 2 diabetes mellitus (HCC) 04/14/2016   Resolved Ambulatory Problems    Diagnosis Date Noted  . No Resolved Ambulatory Problems   Past Medical History:  Diagnosis Date  . Diabetes mellitus without complication (HCC)   . Hypertension    .Connie Cardenas. Family History  Problem Relation Age of Onset  . Cancer Mother        colon  . Hypertension Mother   . Heart attack Father   . Hyperlipidemia Father   . Stroke Paternal Grandmother   . Cancer Sister        non-hodkins lymphoma     Review of Systems  All other systems reviewed and are negative.      Objective:   Physical Exam  Constitutional: She is oriented to person, place, and time. She appears well-developed and well-nourished.  HENT:  Head: Normocephalic and atraumatic.  Neck: Normal range of motion. Neck supple.  Cardiovascular: Normal rate and regular rhythm.  3/6 SEM.   Pulmonary/Chest: Effort normal and breath sounds normal.  Musculoskeletal:       Feet:  Lymphadenopathy:    She has no cervical  adenopathy.  Neurological: [name], place, and time.  Psychiatric: She has a normal mood and affect. Her behavior is normal.          Assessment & Plan:  Connie Cardenas.Connie Cardenas.Elease Hashimotoatricia was seen today for diabetes.  Diagnoses and all orders for this visit:  Type 2 diabetes mellitus without complication, without long-term current use of insulin (HCC) -     POCT HgB A1C -     metFORMIN (GLUCOPHAGE) 1000 MG tablet; Take 1 tablet (1,000 mg total) by mouth 2 (two) times daily. -     sitaGLIPtin (JANUVIA) 100 MG tablet; Take 1 tablet (100 mg total) by mouth daily. -     Dulaglutide (TRULICITY) 1.5 MG/0.5ML SOPN; Inject 1.5 mg into the skin once a week. -     Lipid Panel w/reflex Direct LDL -     COMPLETE METABOLIC PANEL WITH GFR  Visit for screening mammogram -     MM SCREENING BREAST TOMO BILATERAL  Toenail bruise, left, initial encounter  Mixed hyperlipidemia -     Lipid Panel w/reflex Direct LDL  Colon cancer screening -     Ambulatory referral to Gastroenterology  Essential hypertension -     lisinopril (PRINIVIL,ZESTRIL) 20 MG tablet; Take 1 tablet (20 mg total) by mouth daily.   DM- .Connie Cardenas. Lab Results  Component Value Date   HGBA1C 8.8 04/12/2017  only down .1 from 3 months ago.  Increase trulicity to 1.5.  Refilled actos and metformin.  Discussed diabetic diet.  On ACE.  On ASA. Checking lipids today.  Needs eye exam.   .. Diabetic Foot Exam - Simple   Simple Foot Form Visual Inspection No deformities, no ulcerations, no other skin breakdown bilaterally:  Yes Sensation Testing Intact to touch and monofilament testing bilaterally:  Yes Pulse Check Posterior Tibialis and Dorsalis pulse intact bilaterally:  Yes Comments      Follow up in 3 months.   Discussed with patient dark spot on great toe appears to be a toenail bruise. Should slowly resolve.  Follow up if getting bigger or any redness or tenderness occurs. Certainly can make a podiatry  referral if needed.

## 2017-04-17 NOTE — Progress Notes (Signed)
As long as she is aware of risk. 6 month follow up is ok.

## 2017-05-14 DIAGNOSIS — H5213 Myopia, bilateral: Secondary | ICD-10-CM | POA: Diagnosis not present

## 2017-05-14 DIAGNOSIS — E119 Type 2 diabetes mellitus without complications: Secondary | ICD-10-CM | POA: Diagnosis not present

## 2017-05-14 LAB — HM DIABETES EYE EXAM

## 2017-05-23 ENCOUNTER — Encounter: Payer: Self-pay | Admitting: Physician Assistant

## 2017-06-26 DIAGNOSIS — Z1211 Encounter for screening for malignant neoplasm of colon: Secondary | ICD-10-CM | POA: Diagnosis not present

## 2017-06-26 DIAGNOSIS — K5730 Diverticulosis of large intestine without perforation or abscess without bleeding: Secondary | ICD-10-CM | POA: Diagnosis not present

## 2017-06-26 DIAGNOSIS — K640 First degree hemorrhoids: Secondary | ICD-10-CM | POA: Diagnosis not present

## 2017-06-26 LAB — HM COLONOSCOPY

## 2017-07-03 ENCOUNTER — Encounter: Payer: Self-pay | Admitting: Physician Assistant

## 2017-07-15 ENCOUNTER — Encounter: Payer: Self-pay | Admitting: Physician Assistant

## 2017-07-15 ENCOUNTER — Ambulatory Visit (INDEPENDENT_AMBULATORY_CARE_PROVIDER_SITE_OTHER): Payer: BLUE CROSS/BLUE SHIELD | Admitting: Physician Assistant

## 2017-07-15 VITALS — BP 132/72 | HR 90 | Ht 65.0 in | Wt 182.0 lb

## 2017-07-15 DIAGNOSIS — R0989 Other specified symptoms and signs involving the circulatory and respiratory systems: Secondary | ICD-10-CM | POA: Diagnosis not present

## 2017-07-15 DIAGNOSIS — E782 Mixed hyperlipidemia: Secondary | ICD-10-CM | POA: Diagnosis not present

## 2017-07-15 DIAGNOSIS — R011 Cardiac murmur, unspecified: Secondary | ICD-10-CM | POA: Diagnosis not present

## 2017-07-15 DIAGNOSIS — R748 Abnormal levels of other serum enzymes: Secondary | ICD-10-CM | POA: Diagnosis not present

## 2017-07-15 DIAGNOSIS — E119 Type 2 diabetes mellitus without complications: Secondary | ICD-10-CM

## 2017-07-15 LAB — POCT GLYCOSYLATED HEMOGLOBIN (HGB A1C): Hemoglobin A1C: 8.3

## 2017-07-15 NOTE — Patient Instructions (Signed)
welchol repatha zetia

## 2017-07-15 NOTE — Progress Notes (Addendum)
Subjective:    Patient ID: Connie Cardenas, female    DOB: 1950/06/03, 67 y.o.   MRN: [id number]  HPI Kenijah is a 67 year old female with a history of type 2 diabetes, HTN, and hyperlipidemia who presents for a 3 month follow up and medication refill.   She denies any complaints at this time including headaches, chest pain, visual changes, weakness, ulcerations. She has been taking her medications as prescribed. She takes trulicity/metformin/januvia. She states she has been working on her diet and has been attempting to cut out some unhealthy snacking throughout the day. She has also started to do intermittent fasting in hopes to lose weight. She walks about a mile 3 times per week and is trying to do that 5 times per week. Last ophthalmology appointment was in January - only finding was early stages of cataracts with no interventions at this time.  She rarely checks her sugars at home with the last couple being 180 (she had one episode of 103 during a colonoscopy prep).  Past Medical History:  Diagnosis Date  . Diabetes mellitus without complication (HCC)   . Hypertension        Review of Systems  Constitutional: Negative for activity change, appetite change and fatigue.  Eyes: Negative for photophobia, pain and visual disturbance.  Respiratory: Negative for chest tightness and shortness of breath.   Cardiovascular: Negative for chest pain and palpitations.  Gastrointestinal: Negative for diarrhea, nausea and vomiting.  Skin: Negative for color change, pallor and wound.  Neurological: Negative for dizziness, syncope, numbness and headaches.       Objective:   Physical Exam  Constitutional: She is oriented to person, place, and time. She appears well-developed and well-nourished.  HENT:  Head: Normocephalic and atraumatic.  Eyes: Conjunctivae are normal.  Cardiovascular: Normal rate and regular rhythm.  Murmur heard. Carotid bruit noted SEM heard best over 2nd  intercostal space and mitral space.   Pulmonary/Chest: Effort normal and breath sounds normal.  Neurological: [name], place, and time.  Skin: Skin is warm and dry. No rash noted. No erythema.  Psychiatric: She has a normal mood and affect. Her behavior is normal.  Nursing note and vitals reviewed.         Assessment & Plan:  Marland KitchenMarland KitchenNikayla was seen today for diabetes and hypertension.  Diagnoses and all orders for this visit:  Type 2 diabetes mellitus without complication, without long-term current use of insulin (HCC) -     POCT HgB A1C  Mixed hyperlipidemia -     Lipid Panel w/reflex Direct LDL  Elevated liver enzymes -     Hepatic function panel  Systolic ejection murmur -     ECHOCARDIOGRAM COMPLETE; Future  Left carotid bruit -     VAS US CAROTID; Future   .Marland Kitchen Results for orders placed or performed in visit on 07/15/17  POCT HgB A1C  Result Value Ref Range   Hemoglobin A1C 8.3    A!c has improved but still not to goal.  Discussed adding medication. Pt declined today. Aware of risk.  Will continue to work on diet.  Pt declines statin. She is taking red yeast rice, fish oil, ASA.  Discussed other cholesterol medications such as welchol/zetia. She will look into these.  BP controlled.  Will call and get eye exam.  .. Diabetic Foot Exam - Simple   Simple Foot Form Visual Inspection No deformities, no ulcerations, no other skin breakdown bilaterally:  Yes Sensation  Testing Intact to touch and monofilament testing bilaterally:  Yes Pulse Check Posterior Tibialis and Dorsalis pulse intact bilaterally:  Yes Comments    New murmur with left carotid bruit. Will get echo and carotid u/s.   Will get repeat labs in 2 months after continuing diet/lifestyle changes. Will recheck liver enymes and consider u/s in future if staying elevated.   Follow up in 3 months.

## 2017-07-15 NOTE — Progress Notes (Deleted)
Carotid ultar

## 2017-07-16 ENCOUNTER — Other Ambulatory Visit: Payer: Self-pay | Admitting: *Deleted

## 2017-07-16 DIAGNOSIS — E119 Type 2 diabetes mellitus without complications: Secondary | ICD-10-CM

## 2017-07-16 MED ORDER — DULAGLUTIDE 1.5 MG/0.5ML ~~LOC~~ SOAJ: 1.5000 mg | SUBCUTANEOUS | 2 refills | Status: DC

## 2017-08-07 ENCOUNTER — Ambulatory Visit (HOSPITAL_BASED_OUTPATIENT_CLINIC_OR_DEPARTMENT_OTHER): Payer: BLUE CROSS/BLUE SHIELD

## 2017-08-15 ENCOUNTER — Encounter (HOSPITAL_BASED_OUTPATIENT_CLINIC_OR_DEPARTMENT_OTHER): Payer: BLUE CROSS/BLUE SHIELD

## 2017-08-15 ENCOUNTER — Other Ambulatory Visit (HOSPITAL_BASED_OUTPATIENT_CLINIC_OR_DEPARTMENT_OTHER): Payer: BLUE CROSS/BLUE SHIELD

## 2017-10-10 ENCOUNTER — Other Ambulatory Visit: Payer: Self-pay | Admitting: Physician Assistant

## 2017-10-10 DIAGNOSIS — E119 Type 2 diabetes mellitus without complications: Secondary | ICD-10-CM

## 2017-10-10 NOTE — Telephone Encounter (Signed)
Needs follow up appointment.  

## 2017-10-15 ENCOUNTER — Ambulatory Visit: Payer: BLUE CROSS/BLUE SHIELD | Admitting: Physician Assistant

## 2017-10-16 ENCOUNTER — Other Ambulatory Visit: Payer: Self-pay | Admitting: Physician Assistant

## 2017-10-16 DIAGNOSIS — E119 Type 2 diabetes mellitus without complications: Secondary | ICD-10-CM

## 2017-10-31 ENCOUNTER — Other Ambulatory Visit: Payer: Self-pay | Admitting: Physician Assistant

## 2017-10-31 DIAGNOSIS — I10 Essential (primary) hypertension: Secondary | ICD-10-CM

## 2018-01-04 ENCOUNTER — Other Ambulatory Visit: Payer: Self-pay | Admitting: Physician Assistant

## 2018-01-04 DIAGNOSIS — E119 Type 2 diabetes mellitus without complications: Secondary | ICD-10-CM

## 2018-01-07 NOTE — Telephone Encounter (Signed)
This medication refill came up in my basket today. I just wanted to send it to you and ask if it would be okay to refill? Patient was last seen and last A1C was done in March of 2019. Just wanted to make sure before I refill it. Thanks!

## 2018-01-07 NOTE — Telephone Encounter (Signed)
Ok to give one month. Will you please call patient and let her know time to schedule appt.

## 2018-01-11 ENCOUNTER — Other Ambulatory Visit: Payer: Self-pay | Admitting: Physician Assistant

## 2018-01-11 DIAGNOSIS — E119 Type 2 diabetes mellitus without complications: Secondary | ICD-10-CM

## 2018-02-20 ENCOUNTER — Other Ambulatory Visit: Payer: Self-pay | Admitting: Physician Assistant

## 2018-02-20 DIAGNOSIS — I10 Essential (primary) hypertension: Secondary | ICD-10-CM

## 2018-03-19 ENCOUNTER — Encounter: Payer: Self-pay | Admitting: Physician Assistant

## 2018-03-19 ENCOUNTER — Ambulatory Visit (INDEPENDENT_AMBULATORY_CARE_PROVIDER_SITE_OTHER): Payer: BLUE CROSS/BLUE SHIELD | Admitting: Physician Assistant

## 2018-03-19 VITALS — BP 127/76 | HR 93 | Temp 98.0°F | Wt 182.6 lb

## 2018-03-19 DIAGNOSIS — R0989 Other specified symptoms and signs involving the circulatory and respiratory systems: Secondary | ICD-10-CM

## 2018-03-19 DIAGNOSIS — R011 Cardiac murmur, unspecified: Secondary | ICD-10-CM

## 2018-03-19 DIAGNOSIS — Z1231 Encounter for screening mammogram for malignant neoplasm of breast: Secondary | ICD-10-CM

## 2018-03-19 DIAGNOSIS — E119 Type 2 diabetes mellitus without complications: Secondary | ICD-10-CM | POA: Diagnosis not present

## 2018-03-19 DIAGNOSIS — E785 Hyperlipidemia, unspecified: Secondary | ICD-10-CM | POA: Diagnosis not present

## 2018-03-19 DIAGNOSIS — R748 Abnormal levels of other serum enzymes: Secondary | ICD-10-CM

## 2018-03-19 DIAGNOSIS — I10 Essential (primary) hypertension: Secondary | ICD-10-CM

## 2018-03-19 LAB — POCT GLYCOSYLATED HEMOGLOBIN (HGB A1C): HEMOGLOBIN A1C: 9.6 % — AB (ref 4.0–5.6)

## 2018-03-19 MED ORDER — AMBULATORY NON FORMULARY MEDICATION: 0 refills | Status: AC

## 2018-03-19 MED ORDER — PIOGLITAZONE HCL 30 MG PO TABS: 30.0000 mg | ORAL_TABLET | Freq: Every day | ORAL | 2 refills | Status: DC

## 2018-03-19 MED ORDER — METFORMIN HCL 1000 MG PO TABS: 1000.0000 mg | ORAL_TABLET | Freq: Two times a day (BID) | ORAL | 0 refills | Status: DC

## 2018-03-19 MED ORDER — DULAGLUTIDE 1.5 MG/0.5ML ~~LOC~~ SOAJ: SUBCUTANEOUS | 0 refills | Status: DC

## 2018-03-19 MED ORDER — SITAGLIPTIN PHOSPHATE 100 MG PO TABS: 100.0000 mg | ORAL_TABLET | Freq: Every day | ORAL | 0 refills | Status: DC

## 2018-03-19 NOTE — Patient Instructions (Signed)
Pioglitazone tablets  What is this medicine?  PIOGLITAZONE (pye oh GLI ta zone) helps to treat type 2 diabetes. It helps to control blood sugar. Treatment is combined with diet and exercise.  This medicine may be used for other purposes; ask your health care provider or pharmacist if you have questions.  COMMON BRAND NAME(S): Actos  What should I tell my health care provider before I take this medicine?  They need to know if you have any of these conditions:  -bladder cancer  -diabetic ketoacidosis  -eye disease called macular edema  -heart disease  -heart failure  -kidney disease  -liver disease  -polycystic ovary syndrome  -premenopausal  -swelling of the arms, legs, or feet  -type 1 diabetes  -an unusual or allergic reaction to pioglitazone, other medicines, foods, dyes, or preservatives  -pregnant or trying to get pregnant  -breast-feeding  How should I use this medicine?  Take this medicine by mouth with a glass of water. Follow the directions on the prescription label. Take your medicine at the same time each day. Do not take more often than directed.  A special MedGuide will be given to you by the pharmacist with each prescription and refill. Be sure to read this information carefully each time.  Talk to your pediatrician regarding the use of this medicine in children. Special care may be needed.  Overdosage: If you think you have taken too much of this medicine contact a poison control center or emergency room at once.  NOTE: This medicine is only for you. Do not share this medicine with others.  What if I miss a dose?  If you miss a dose, take it as soon as you can. If it is almost time for your next dose, take only that dose. Do not take double or extra doses.  What may interact with this medicine?  -atorvastatin  -birth control pills or other hormonal methods of birth control  -bosentan  -itraconazole  -ketoconazole  -midazolam  -nifedipine  -other medicines for diabetes, including  insulin  -topiramate  Many medications may cause an increase or decrease in blood sugar, these include:  -alcohol containing beverages  -aspirin and aspirin-like drugs  -chloramphenicol  -chromium  -diuretics  -female hormones, like estrogens or progestins and birth control pills  -heart medicines  -isoniazid  -female hormones or anabolic steroids  -medicines for weight loss  -medicines for allergies, asthma, cold, or cough  -medicines for mental problems  -medicines called MAO Inhibitors like Nardil, Parnate, Marplan, Eldepryl  -niacin  -NSAIDs, medicines for pain and inflammation, like ibuprofen or naproxen  -pentamidine  -phenytoin  -probenecid  -quinolone antibiotics like ciprofloxacin, levofloxacin, ofloxacin  -some herbal dietary supplements  -steroid medicines like prednisone or cortisone  -thyroid medicine  This list may not describe all possible interactions. Give your health care provider a list of all the medicines, herbs, non-prescription drugs, or dietary supplements you use. Also tell them if you smoke, drink alcohol, or use illegal drugs. Some items may interact with your medicine.  What should I watch for while using this medicine?  Visit your doctor or health care professional for regular checks on your progress. You may need regular tests to make sure your liver is working properly.  A test called the HbA1C (A1C) will be monitored. This is a simple blood test. It measures your blood sugar control over the last 2 to 3 months. You will receive this test every 3 to 6 months.  Learn how to check   your blood sugar. Learn the symptoms of low and high blood sugar and how to manage them.  Always carry a quick-source of sugar with you in case you have symptoms of low blood sugar. Examples include hard sugar candy or glucose tablets. Make sure others know that you can choke if you eat or drink when you develop serious symptoms of low blood sugar, such as seizures or unconsciousness. They must get medical help  at once.  Tell your doctor or health care professional if you have high blood sugar. You might need to change the dose of your medicine. If you are sick or exercising more than usual, you might need to change the dose of your medicine.  Do not skip meals. Ask your doctor or health care professional if you should avoid alcohol. Many nonprescription cough and cold products contain sugar or alcohol. These can affect blood sugar.  This medicine may increase your risk of having certain heart problems. Get medical help right away if you have any chest pain or tightness, or pain that radiates to the jaw or down the arm, and shortness of breath. These may be signs of a serious medical condition.  This medicine may cause ovulation in premenopausal women who do not have regular monthly periods. This may increase your chances of becoming pregnant. You should not take this medicine if you become pregnant or think you may be pregnant. Talk with your doctor or health care professional about your birth control options while taking this medicine. Contact your doctor or health care professional right away if think you are pregnant.  Wear a medical ID bracelet or chain, and carry a card that describes your disease and details of your medicine and dosage times.  What side effects may I notice from receiving this medicine?  Side effects that you should report to your doctor or health care professional as soon as possible:  -allergic reactions like skin rash, itching or hives, swelling of the face, lips, or tongue  -blood in the urine  -bone fractures  -breathing problems  -changes in vision  -dark urine  -fever, chills  -increased urination  -pain when urinating  -signs and symptoms of low blood sugar such as feeling anxious, confusion, dizziness, increased hunger, unusually weak or tired, sweating, shakiness, cold, irritable, headache, blurred vision, fast heartbeat, loss of consciousness  -sudden weight gain  -swelling of the ankles,  feet, hands  -yellowing of the eyes or skin  Side effects that usually do not require medical attention (report to your doctor or health care professional if they continue or are bothersome):  -headache  -mild joint or muscle pain  -stuffy or runny nose  -sore throat  This list may not describe all possible side effects. Call your doctor for medical advice about side effects. You may report side effects to FDA at 1-800-FDA-1088.  Where should I keep my medicine?  Keep out of the reach of children.  Store at room temperature between 15 and 30 degrees C (59 and 86 degrees F). Keep container tightly closed and protect from moisture and humidity. Throw away any unused medicine after the expiration date.  NOTE: This sheet is a summary. It may not cover all possible information. If you have questions about this medicine, talk to your doctor, pharmacist, or health care provider.   2018 Elsevier/Gold Standard (2012-07-22 14:51:48)

## 2018-03-19 NOTE — Progress Notes (Signed)
Subjective:    Patient ID: Maren ReamerPatricia Siordia, female    DOB: 11-Jan-1951, 67 y.o.   MRN: [id number]  HPI Patient is a 67 year old female with hypertension, type 2 diabetes, hyperlipidemia who presents to the clinic for follow-up and medication management.  Patient has diabetes but is not checking her sugars at home.  She is tried to make some diet changes but unaware if these are actually effective or not.  She has been instructed to take a statin in the past but has declined this.  She denies any hypoglycemic symptoms.  Hypertension-she is taking her lisinopril daily.  She denies any chest pains, palpitations, shortness of breath.  She admits she never got the echo or carotid Dopplers done that were ordered at last visit.   .. Active Ambulatory Problems    Diagnosis Date Noted  . Hyperlipidemia 10/04/2015  . Type 2 diabetes mellitus without complication, without long-term current use of insulin (HCC) 10/04/2015  . Essential hypertension, benign 10/04/2015  . Obesity 01/04/2016  . Hyperlipidemia associated with type 2 diabetes mellitus (HCC) 04/14/2016  . Elevated liver enzymes 07/15/2017  . Systolic ejection murmur 07/15/2017  . Left carotid bruit 07/15/2017   Resolved Ambulatory Problems    Diagnosis Date Noted  . No Resolved Ambulatory Problems   Past Medical History:  Diagnosis Date  . Diabetes mellitus without complication (HCC)   . Hypertension       Review of Systems  All other systems reviewed and are negative.      Objective:   Physical Exam  Constitutional: She is oriented to person, place, and time. She appears well-developed and well-nourished.  HENT:  Head: Normocephalic and atraumatic.  Cardiovascular: Normal rate and regular rhythm.  Murmur heard. Left carotid bruit.   Pulmonary/Chest: Effort normal and breath sounds normal.  Lymphadenopathy:    She has no cervical adenopathy.  Neurological: [name], place, and time.   Psychiatric: She has a normal mood and affect. Her behavior is normal.          Assessment & Plan:  Marland Kitchen.Marland Kitchen.Elease Hashimotoatricia was seen today for diabetes.  Diagnoses and all orders for this visit:  Type 2 diabetes mellitus without complication, without long-term current use of insulin (HCC) -     POCT HgB A1C -     AMBULATORY NON FORMULARY MEDICATION; Glucometer lancets and test strips for DM to check 1-2 times a day. -     pioglitazone (ACTOS) 30 MG tablet; Take 1 tablet (30 mg total) by mouth daily. -     Dulaglutide (TRULICITY) 1.5 MG/0.5ML SOPN; INJECT 1.5 MG INTO THE SKIN ONCE A WEEK. -     sitaGLIPtin (JANUVIA) 100 MG tablet; Take 1 tablet (100 mg total) by mouth daily. -     metFORMIN (GLUCOPHAGE) 1000 MG tablet; Take 1 tablet (1,000 mg total) by mouth 2 (two) times daily. -     COMPLETE METABOLIC PANEL WITH GFR  Hyperlipidemia, unspecified hyperlipidemia type -     Lipid Panel w/reflex Direct LDL  Essential hypertension, benign  Systolic ejection murmur -     ECHOCARDIOGRAM COMPLETE  Left carotid bruit -     US Carotid Bilateral   .. Results for orders placed or performed in visit on 03/19/18  POCT HgB A1C  Result Value Ref Range   Hemoglobin A1C 9.6 (A) 4.0 - 5.6 %   HbA1c POC (<> result, manual entry)     HbA1c, POC (prediabetic range)     HbA1c,  POC (controlled diabetic range)     Not controlled. Increased from 8.3.  Discussed options of adding long acting insulin vs Actos. Patient really wants to avoid insulin at this point. Started Actos once a day to the regimen of metformin, Januvia, Trulicity. I spent a long time discussing importance of controlling blood sugar and the risk factors that uncontrolled diabetes presents with. Discussed adding a statin.  She declines at this time.  She is aware of the cardiovascular risk.  We will recheck her lipid panel today. Blood pressure is controlled and on ACE. I did strongly recommend patient to start checking her blood sugars  at least fasting in the morning a few times a week.  I think this would help her to know how she is doing in between office visits. Goal fasting glucose would be 90-130.    I think this could also help her with her diet.  Glucometer information sent to the pharmacy.  Pt declined flu shot.  Strongly encourage patient to get echo and carotid Doppler due to her murmur and bruit.  These were ordered earlier this year but she never had them done.  I did discuss the side effects of Actos that can worsen heart failure.  If she has any swelling she is to stop the medication and let our office know.  Marland Kitchen.Spent 50 minutes with patient and greater than 50 percent of visit spent counseling patient regarding treatment plan.

## 2018-03-24 ENCOUNTER — Other Ambulatory Visit: Payer: Self-pay | Admitting: Physician Assistant

## 2018-03-24 DIAGNOSIS — E785 Hyperlipidemia, unspecified: Secondary | ICD-10-CM | POA: Diagnosis not present

## 2018-03-24 DIAGNOSIS — I10 Essential (primary) hypertension: Secondary | ICD-10-CM

## 2018-03-24 DIAGNOSIS — E119 Type 2 diabetes mellitus without complications: Secondary | ICD-10-CM | POA: Diagnosis not present

## 2018-03-25 ENCOUNTER — Encounter: Payer: Self-pay | Admitting: Physician Assistant

## 2018-03-25 LAB — COMPLETE METABOLIC PANEL WITH GFR
AG RATIO: 1.6 (calc) (ref 1.0–2.5)
ALKALINE PHOSPHATASE (APISO): 77 U/L (ref 33–130)
ALT: 138 U/L — AB (ref 6–29)
AST: 107 U/L — AB (ref 10–35)
Albumin: 4.2 g/dL (ref 3.6–5.1)
BILIRUBIN TOTAL: 0.4 mg/dL (ref 0.2–1.2)
BUN: 11 mg/dL (ref 7–25)
CHLORIDE: 103 mmol/L (ref 98–110)
CO2: 26 mmol/L (ref 20–32)
Calcium: 9.6 mg/dL (ref 8.6–10.4)
Creat: 0.73 mg/dL (ref 0.50–0.99)
GFR, Est African American: 101 mL/min/{1.73_m2} (ref >=60)
GFR, Est Non African American: 87 mL/min/{1.73_m2} (ref >=60)
GLOBULIN: 2.6 g/dL (ref 1.9–3.7)
Glucose, Bld: 190 mg/dL — ABNORMAL HIGH (ref 65–99)
POTASSIUM: 4.1 mmol/L (ref 3.5–5.3)
SODIUM: 138 mmol/L (ref 135–146)
Total Protein: 6.8 g/dL (ref 6.1–8.1)

## 2018-03-25 LAB — LIPID PANEL W/REFLEX DIRECT LDL
CHOLESTEROL: 242 mg/dL — AB (ref <200)
HDL: 53 mg/dL (ref >50)
LDL Cholesterol (Calc): 161 mg/dL (calc) — ABNORMAL HIGH
NON-HDL CHOLESTEROL (CALC): 189 mg/dL — AB (ref <130)
TRIGLYCERIDES: 153 mg/dL — AB (ref <150)
Total CHOL/HDL Ratio: 4.6 (calc) (ref <5.0)

## 2018-03-25 NOTE — Progress Notes (Signed)
Call pt: cholesterol is elevated. Goal LDL is under 70 with diabetes. We need to start a statin to lower CV risk. What are your thoughts?   Liver enzymes have elevated more. We need to get abdominal u/s. Are you ok with ordering? Have you been taking a lot of tylenol or driniking alcohol?

## 2018-03-26 ENCOUNTER — Other Ambulatory Visit: Payer: Self-pay | Admitting: Physician Assistant

## 2018-03-26 DIAGNOSIS — R0989 Other specified symptoms and signs involving the circulatory and respiratory systems: Secondary | ICD-10-CM

## 2018-03-26 NOTE — Progress Notes (Signed)
Order changed per radiology protocol.  

## 2018-04-01 ENCOUNTER — Other Ambulatory Visit: Payer: Self-pay | Admitting: Physician Assistant

## 2018-04-01 DIAGNOSIS — R748 Abnormal levels of other serum enzymes: Secondary | ICD-10-CM

## 2018-04-01 NOTE — Progress Notes (Signed)
Per result notes:  "Notes recorded by Jomarie LongsBreeback, [name], PA-C on 03/25/2018 at 5:36 PM EST  .Marland Kitchen. Liver enzymes have elevated more. We need to get abdominal u/s. Are you ok with ordering? Have you been taking a lot of tylenol or driniking alcohol? "  Pt OK with ultrasound. Orders pended, can you please approve and sign?   Note to covering provider, PCP out of office

## 2018-04-02 NOTE — Addendum Note (Signed)
Addended by: Jomarie LongsBREEBACK, Donavan Kerlin L on: 04/02/2018 09:18 AM   Modules accepted: Orders

## 2018-04-02 NOTE — Progress Notes (Signed)
Done

## 2018-04-04 ENCOUNTER — Ambulatory Visit (HOSPITAL_BASED_OUTPATIENT_CLINIC_OR_DEPARTMENT_OTHER)
Admission: RE | Admit: 2018-04-04 | Discharge: 2018-04-04 | Disposition: A | Discharge status: Home / Self Care | Payer: BLUE CROSS/BLUE SHIELD | Source: Ambulatory Visit | Attending: Physician Assistant | Admitting: Physician Assistant

## 2018-04-04 ENCOUNTER — Ambulatory Visit (HOSPITAL_BASED_OUTPATIENT_CLINIC_OR_DEPARTMENT_OTHER)
Admission: RE | Admit: 2018-04-04 | Discharge: 2018-04-04 | Disposition: A | Discharge status: Home / Self Care | Payer: BLUE CROSS/BLUE SHIELD | Source: Ambulatory Visit

## 2018-04-04 DIAGNOSIS — K7689 Other specified diseases of liver: Secondary | ICD-10-CM | POA: Diagnosis not present

## 2018-04-04 DIAGNOSIS — R0989 Other specified symptoms and signs involving the circulatory and respiratory systems: Secondary | ICD-10-CM | POA: Diagnosis not present

## 2018-04-04 DIAGNOSIS — R011 Cardiac murmur, unspecified: Secondary | ICD-10-CM | POA: Insufficient documentation

## 2018-04-04 DIAGNOSIS — R748 Abnormal levels of other serum enzymes: Secondary | ICD-10-CM

## 2018-04-04 NOTE — Progress Notes (Signed)
  Echocardiogram 2D Echocardiogram has been performed.  Connie Cardenas 04/04/2018, 11:31 AM

## 2018-04-04 NOTE — Progress Notes (Signed)
Carotid Duplex Performed Bilaterally     Right Carotid:Velocities in the right ICA are consistent with a 60-79% stenosis.  Left Carotid: Velocities in the left ICA are consistent with a 1-39% stenosis.    Vertebrals: Bilateral vertebral arteries demonstrate antegrade flow.  Subclavians:Normal flow hemodynamics were seen in bilateral subclavian arteries.      04/04/18 Sinda DuJoseph Nabor Thomann RDCS, RVT

## 2018-04-07 ENCOUNTER — Ambulatory Visit (HOSPITAL_BASED_OUTPATIENT_CLINIC_OR_DEPARTMENT_OTHER): Payer: BLUE CROSS/BLUE SHIELD

## 2018-04-07 NOTE — Progress Notes (Signed)
US shows benign appearing liver cysts [facility] to ensure cysts are not changing

## 2018-04-08 ENCOUNTER — Encounter: Payer: Self-pay | Admitting: Physician Assistant

## 2018-04-08 DIAGNOSIS — K7689 Other specified diseases of liver: Secondary | ICD-10-CM | POA: Insufficient documentation

## 2018-04-10 ENCOUNTER — Ambulatory Visit (INDEPENDENT_AMBULATORY_CARE_PROVIDER_SITE_OTHER): Payer: BLUE CROSS/BLUE SHIELD

## 2018-04-10 DIAGNOSIS — R928 Other abnormal and inconclusive findings on diagnostic imaging of breast: Secondary | ICD-10-CM | POA: Diagnosis not present

## 2018-04-10 DIAGNOSIS — Z1231 Encounter for screening mammogram for malignant neoplasm of breast: Secondary | ICD-10-CM | POA: Diagnosis not present

## 2018-04-11 ENCOUNTER — Other Ambulatory Visit: Payer: Self-pay | Admitting: Physician Assistant

## 2018-04-11 ENCOUNTER — Encounter: Payer: Self-pay | Admitting: Physician Assistant

## 2018-04-11 DIAGNOSIS — E785 Hyperlipidemia, unspecified: Secondary | ICD-10-CM

## 2018-04-11 DIAGNOSIS — I350 Nonrheumatic aortic (valve) stenosis: Secondary | ICD-10-CM | POA: Insufficient documentation

## 2018-04-11 DIAGNOSIS — Q231 Congenital insufficiency of aortic valve: Secondary | ICD-10-CM | POA: Insufficient documentation

## 2018-04-11 DIAGNOSIS — N6002 Solitary cyst of left breast: Secondary | ICD-10-CM | POA: Insufficient documentation

## 2018-04-11 DIAGNOSIS — I6521 Occlusion and stenosis of right carotid artery: Secondary | ICD-10-CM

## 2018-04-11 NOTE — Progress Notes (Signed)
Pt called with results. Call back if imaging does not contact her for more images.

## 2018-04-11 NOTE — Progress Notes (Signed)
Pt called with results

## 2018-04-11 NOTE — Progress Notes (Signed)
Pt called with results. Referral made to vascular.

## 2018-04-11 NOTE — Progress Notes (Signed)
a 

## 2018-04-20 ENCOUNTER — Other Ambulatory Visit: Payer: Self-pay | Admitting: Physician Assistant

## 2018-04-20 DIAGNOSIS — E119 Type 2 diabetes mellitus without complications: Secondary | ICD-10-CM

## 2018-04-21 ENCOUNTER — Other Ambulatory Visit: Payer: Self-pay | Admitting: Physician Assistant

## 2018-04-21 DIAGNOSIS — R928 Other abnormal and inconclusive findings on diagnostic imaging of breast: Secondary | ICD-10-CM

## 2018-05-05 ENCOUNTER — Other Ambulatory Visit: Payer: Self-pay | Admitting: Physician Assistant

## 2018-05-05 DIAGNOSIS — R928 Other abnormal and inconclusive findings on diagnostic imaging of breast: Secondary | ICD-10-CM

## 2018-05-06 ENCOUNTER — Other Ambulatory Visit: Payer: Self-pay

## 2018-05-06 DIAGNOSIS — I6521 Occlusion and stenosis of right carotid artery: Secondary | ICD-10-CM

## 2018-05-15 ENCOUNTER — Ambulatory Visit
Admission: RE | Admit: 2018-05-15 | Discharge: 2018-05-15 | Disposition: A | Discharge status: Home / Self Care | Payer: BLUE CROSS/BLUE SHIELD | Source: Ambulatory Visit | Attending: Physician Assistant | Admitting: Physician Assistant

## 2018-05-15 DIAGNOSIS — R928 Other abnormal and inconclusive findings on diagnostic imaging of breast: Secondary | ICD-10-CM

## 2018-05-15 DIAGNOSIS — N6012 Diffuse cystic mastopathy of left breast: Secondary | ICD-10-CM | POA: Diagnosis not present

## 2018-06-10 ENCOUNTER — Ambulatory Visit (INDEPENDENT_AMBULATORY_CARE_PROVIDER_SITE_OTHER): Payer: BLUE CROSS/BLUE SHIELD | Admitting: Vascular Surgery

## 2018-06-10 ENCOUNTER — Other Ambulatory Visit: Payer: Self-pay

## 2018-06-10 ENCOUNTER — Ambulatory Visit (HOSPITAL_COMMUNITY)
Admission: RE | Admit: 2018-06-10 | Discharge: 2018-06-10 | Disposition: A | Discharge status: Home / Self Care | Payer: BLUE CROSS/BLUE SHIELD | Source: Ambulatory Visit | Attending: Vascular Surgery | Admitting: Vascular Surgery

## 2018-06-10 ENCOUNTER — Encounter: Payer: Self-pay | Admitting: Vascular Surgery

## 2018-06-10 VITALS — BP 134/80 | HR 89 | Temp 97.7°F | Resp 16 | Ht 65.0 in | Wt 182.0 lb

## 2018-06-10 DIAGNOSIS — I6521 Occlusion and stenosis of right carotid artery: Secondary | ICD-10-CM | POA: Diagnosis not present

## 2018-06-10 NOTE — Progress Notes (Signed)
[facility]  Patient name: Connie Reameratricia Corbello MRN: [id number] DOB: June 01, 1950 Sex: female  REASON FOR CONSULT: Valuation of right carotid stenosis  HPI: Connie Reameratricia Malicoat is a 68 y.o. female, who is here today for discussion of recent outpatient study suggesting 60 to 79% right internal carotid artery stenosis.  She is asymptomatic.  She does report an episode approximately 15 years ago when she had a tingling sensation in both arms.  She had an MRI which was normal at that time.  She denies any focal deficits.  Specifically no amaurosis fugax, transient ischemic attack or stroke.  No a aphasia.  She is a diabetic and hypertensive but otherwise is very healthy.  Past Medical History:  Diagnosis Date  . Diabetes mellitus without complication (HCC)   . Hypertension     Family History  Problem Relation Age of Onset  . Cancer Sister        non-hodkins lymphoma  . Cancer Mother        colon  . Hypertension Mother   . Heart attack Father   . Hyperlipidemia Father   . Stroke Paternal Grandmother     SOCIAL HISTORY: Social History   Socioeconomic History  . Marital status: Married    Spouse name: Not on file  . Number of children: Not on file  . Years of education: Not on file  . Highest education level: Not on file  Occupational History  . Not on file  Social Needs  . Financial resource strain: Not on file  . Food insecurity:    Worry: Not on file    Inability: Not on file  . Transportation needs:    Medical: Not on file    Non-medical: Not on file  Tobacco Use  . Smoking status: Never Smoker  . Smokeless tobacco: Never Used  Substance and Sexual Activity  . Alcohol use: No    Alcohol/week: 0.0 standard drinks  . Drug use: No  . Sexual activity: Not Currently  Lifestyle  . Physical activity:    Days per week: Not on file    Minutes per session: Not on file  . Stress: Not on file  Relationships  .  Social connections:    Talks on phone: Not on file    Gets together: Not on file    Attends religious service: Not on file    Active member of club or organization: Not on file    Attends meetings of clubs or organizations: Not on file    Relationship status: Not on file  . Intimate partner violence:    Fear of current or ex partner: Not on file    Emotionally abused: Not on file    Physically abused: Not on file    Forced sexual activity: Not on file  Other Topics Concern  . Not on file  Social History Narrative  . Not on file    Allergies  Allergen Reactions  . Marcelline DeistFarxiga [Dapagliflozin]     Recurrent yeast infections.   . Penicillins     Current Outpatient Medications  Medication Sig Dispense Refill  . AMBULATORY NON FORMULARY MEDICATION Glucometer lancets and test strips for DM to check 1-2 times a day. 100 each 0  . Ascorbic Acid (VITAMIN C PO) Take by mouth.    Marland Kitchen. aspirin 81 MG tablet Take 81 mg by mouth daily.    . Cholecalciferol (VITAMIN D PO) Take by mouth.    . Dulaglutide (TRULICITY) 1.5 MG/0.5ML SOPN  INJECT 1.5 MG INTO THE SKIN ONCE A WEEK. 12 pen 0  . KRILL OIL PO Take by mouth daily.    Marland Kitchen lisinopril (PRINIVIL,ZESTRIL) 20 MG tablet Take 1 tablet (20 mg total) by mouth daily. 90 tablet 1  . metFORMIN (GLUCOPHAGE) 1000 MG tablet Take 1 tablet (1,000 mg total) by mouth 2 (two) times daily. 180 tablet 0  . pioglitazone (ACTOS) 30 MG tablet TAKE 1 TABLET BY MOUTH EVERY DAY 90 tablet 0  . Red Yeast Rice Extract (RED YEAST RICE PO) Take by mouth.    . sitaGLIPtin (JANUVIA) 100 MG tablet Take 1 tablet (100 mg total) by mouth daily. 90 tablet 0   No current facility-administered medications for this visit.     REVIEW OF SYSTEMS:  [X]  denotes positive finding, [ ]  denotes negative finding Cardiac  Comments:  Chest pain or chest pressure:    Shortness of breath upon exertion:    Short of breath when lying flat:    Irregular heart rhythm:        Vascular    Pain in  calf, thigh, or hip brought on by ambulation:    Pain in feet at night that wakes you up from your sleep:     Blood clot in your veins:    Leg swelling:         Pulmonary    Oxygen at home:    Productive cough:     Wheezing:         Neurologic    Sudden weakness in arms or legs:     Sudden numbness in arms or legs:     Sudden onset of difficulty speaking or slurred speech:    Temporary loss of vision in one eye:     Problems with dizziness:         Gastrointestinal    Blood in stool:     Vomited blood:         Genitourinary    Burning when urinating:     Blood in urine:        Psychiatric    Major depression:         Hematologic    Bleeding problems:    Problems with blood clotting too easily:        Skin    Rashes or ulcers:        Constitutional    Fever or chills:      PHYSICAL EXAM: Vitals:   06/10/18 0836 06/10/18 0839  BP: 126/77 134/80  Pulse: 86 89  Resp: 16   Temp: 97.7 F (36.5 C)   TempSrc: Oral   SpO2: 100%   Weight: 182 lb (82.6 kg)   Height: 5\' 5"  (1.651 m)     GENERAL: The patient is a well-nourished female, in no acute distress. The vital signs are documented above. CARDIOVASCULAR: Soft carotid bruits bilaterally which may be transmitted murmur.  She does have a heart murmur.  2+ radial and 2+ dorsalis pedis pulses bilaterally PULMONARY: There is good air exchange  ABDOMEN: Soft and non-tender  MUSCULOSKELETAL: There are no major deformities or cyanosis. NEUROLOGIC: No focal weakness or paresthesias are detected. SKIN: There are no ulcers or rashes noted. PSYCHIATRIC: The patient has a normal affect.  DATA:  Carotid duplex at [facility] suggested 60 to 70% right internal carotid artery stenosis and no significant left carotid stenosis  She underwent repeat right carotid duplex in our office today.  This showed a much different finding  with no significant stenosis in the right internal carotid artery or common carotid  artery.  MEDICAL ISSUES: Had long discussion with the patient and her husband present.  I explained the discrepancy between our right carotid duplex in the outlying facility.  I explained that even if the outlying was correct, her velocities are below the threshold where we would recommend endarterectomy based on asymptomatic disease.  I have recommended that we see her again in 6 months with repeat bilateral carotid duplex.  If this shows similar findings with a minimal stenosis, would discontinue follow-up after that.  If she does have moderate to severe stenosis we will continue surveillance.  She knows to notify us if she develops neurologic deficits will present immediately to the emergency room if she has a major deficit.   Larina Earthly, MD FACS [facility] Specialists of [facility] Tel [phone] Pager [phone]

## 2018-06-11 ENCOUNTER — Ambulatory Visit (INDEPENDENT_AMBULATORY_CARE_PROVIDER_SITE_OTHER): Payer: BLUE CROSS/BLUE SHIELD | Admitting: Physician Assistant

## 2018-06-11 ENCOUNTER — Encounter: Payer: Self-pay | Admitting: Physician Assistant

## 2018-06-11 VITALS — BP 132/82 | HR 93 | Temp 98.0°F | Ht 65.0 in | Wt 183.0 lb

## 2018-06-11 DIAGNOSIS — I350 Nonrheumatic aortic (valve) stenosis: Secondary | ICD-10-CM

## 2018-06-11 DIAGNOSIS — E119 Type 2 diabetes mellitus without complications: Secondary | ICD-10-CM | POA: Diagnosis not present

## 2018-06-11 DIAGNOSIS — I6521 Occlusion and stenosis of right carotid artery: Secondary | ICD-10-CM | POA: Diagnosis not present

## 2018-06-11 DIAGNOSIS — M7989 Other specified soft tissue disorders: Secondary | ICD-10-CM | POA: Diagnosis not present

## 2018-06-11 DIAGNOSIS — Z6830 Body mass index (BMI) 30.0-30.9, adult: Secondary | ICD-10-CM

## 2018-06-11 DIAGNOSIS — N6002 Solitary cyst of left breast: Secondary | ICD-10-CM

## 2018-06-11 DIAGNOSIS — E6609 Other obesity due to excess calories: Secondary | ICD-10-CM

## 2018-06-11 DIAGNOSIS — E66811 Obesity, class 1: Secondary | ICD-10-CM

## 2018-06-11 DIAGNOSIS — J069 Acute upper respiratory infection, unspecified: Secondary | ICD-10-CM

## 2018-06-11 LAB — POCT GLYCOSYLATED HEMOGLOBIN (HGB A1C): Hemoglobin A1C: 8 % — AB (ref 4.0–5.6)

## 2018-06-11 MED ORDER — METFORMIN HCL 1000 MG PO TABS: 1000.0000 mg | ORAL_TABLET | Freq: Two times a day (BID) | ORAL | 0 refills | Status: DC

## 2018-06-11 MED ORDER — PIOGLITAZONE HCL 30 MG PO TABS: 30.0000 mg | ORAL_TABLET | Freq: Every day | ORAL | 0 refills | Status: DC

## 2018-06-11 MED ORDER — SEMAGLUTIDE(0.25 OR 0.5MG/DOS) 2 MG/1.5ML ~~LOC~~ SOPN: 0.5000 mg | PEN_INJECTOR | SUBCUTANEOUS | 2 refills | Status: DC

## 2018-06-11 MED ORDER — SITAGLIPTIN PHOSPHATE 100 MG PO TABS: 100.0000 mg | ORAL_TABLET | Freq: Every day | ORAL | 0 refills | Status: DC

## 2018-06-11 NOTE — Patient Instructions (Signed)
Tylenol cold sinus severe Vitamin C and zinc.

## 2018-06-11 NOTE — Progress Notes (Signed)
Subjective:    Patient ID: Connie Cardenas, female    DOB: 09/05/1950, 68 y.o.   MRN: [id number]  HPI  Pt is a 68 yo obese female with aortic vavle stenosis, T2DM, carotid artery stenosis, HTN, HLD who presents to the clinic for 3 month follow up.   Patient reports to be compliant with her diabetes regimen of Trulicity, metformin, Januvia, Actos.  She was resistant to starting the Actos.  She did not start that until January.  Since January she has developed some swelling of her left index finger.  There is some minimal discomfort associated.  She wonders if this could be a side effect.  She denies any open sores or lesions.  She is using her glucometer.  She is not checking every morning but some mornings.  She is getting readings as low as 130s and as high as 200s.  She denies any hypoglycemic events.  Patient was seen yesterday by vascular for her carotid artery stenosis on the right side.  She previously had a carotid Doppler that showed 60 to 79% stenosis in December.  They repeated in their office yesterday and did not find any significant stenosis.  They told her they would follow-up in 6 months and repeat carotid Dopplers.  She was instructed if she had any symptoms neurologically to call their office.  She did have some left breast cysts found after an abnormal screening mammogram.  She will go back to yearly screenings.  This morning she woke up with some postnasal drip, nasal congestion, dry cough and not feeling well.  She has not taken anything this morning.  .. Active Ambulatory Problems    Diagnosis Date Noted  . Dyslipidemia, goal LDL below 70 10/04/2015  . Type 2 diabetes mellitus without complication, without long-term current use of insulin (HCC) 10/04/2015  . Essential hypertension, benign 10/04/2015  . Obesity 01/04/2016  . Hyperlipidemia associated with type 2 diabetes mellitus (HCC) 04/14/2016  . Elevated liver enzymes 07/15/2017  . Systolic ejection murmur  07/15/2017  . Left carotid bruit 07/15/2017  . Liver cyst 04/08/2018  . Breast cyst, left 04/11/2018  . Moderate aortic valve stenosis 04/11/2018  . Bicuspid aortic valve 04/11/2018  . Carotid artery stenosis, asymptomatic, right 04/11/2018  . Swelling of left index finger 06/11/2018   Resolved Ambulatory Problems    Diagnosis Date Noted  . No Resolved Ambulatory Problems   Past Medical History:  Diagnosis Date  . Diabetes mellitus without complication (HCC)   . Hypertension       Review of Systems  All other systems reviewed and are negative.      Objective:   Physical Exam Vitals signs reviewed.  HENT:     Head: Normocephalic and atraumatic.     Nose: Congestion and rhinorrhea present.     Mouth/Throat:     Pharynx: Oropharynx is clear. Posterior oropharyngeal erythema present.  Eyes:     Conjunctiva/sclera: Conjunctivae normal.  Neck:     Vascular: Carotid bruit present.  Cardiovascular:     Rate and Rhythm: Normal rate.     Pulses: Normal pulses.     Heart sounds: Murmur present.  Pulmonary:     Effort: Pulmonary effort is normal.     Breath sounds: Normal breath sounds.  Neurological:     General: No focal deficit present.     Mental Status: She is alert.  Psychiatric:        Mood and Affect: Mood normal.  Assessment & Plan:  Marland KitchenMarland KitchenCamelia was seen today for follow-up.  Diagnoses and all orders for this visit:  Type 2 diabetes mellitus without complication, without long-term current use of insulin (HCC) -     POCT glycosylated hemoglobin (Hb A1C) -     Semaglutide,0.25 or 0.5MG /DOS, (OZEMPIC, 0.25 OR 0.5 MG/DOSE,) 2 MG/1.5ML SOPN; Inject 0.5 mg into the skin once a week. -     Ambulatory referral to diabetic education -     metFORMIN (GLUCOPHAGE) 1000 MG tablet; Take 1 tablet (1,000 mg total) by mouth 2 (two) times daily. -     pioglitazone (ACTOS) 30 MG tablet; Take 1 tablet (30 mg total) by mouth daily. -     sitaGLIPtin (JANUVIA) 100  MG tablet; Take 1 tablet (100 mg total) by mouth daily.  Carotid artery stenosis, asymptomatic, right  Moderate aortic valve stenosis  Swelling of left index finger -     DG Finger Index Left  Class 1 obesity due to excess calories with serious comorbidity and body mass index (BMI) of 30.0 to 30.9 in adult -     Ambulatory referral to diabetic education  Breast cyst, left  Upper respiratory tract infection, unspecified type     .Marland Kitchen Results for orders placed or performed in visit on 06/11/18  POCT glycosylated hemoglobin (Hb A1C)  Result Value Ref Range   Hemoglobin A1C 8.0 (A) 4.0 - 5.6 %   HbA1c POC (<> result, manual entry)     HbA1c, POC (prediabetic range)     HbA1c, POC (controlled diabetic range)     A1C has decreased but not to goal.  Switched trulicity to ozempic for superior A!C control and will continue to be a weekly injection.  Stay on all other oral.  BP elevated today but yesterday had goal reading and woke up sick.  On ACE.  Pt not to LDL goal and declines statins despite known risk.  Up to date eye exam.  Up to date foot exam.  Pt declines pneumonia vaccines today.  Discussed importance of diet and limiting carbs/sugars.  Will make nutrition referral.   Follow up with vascular due to carotid stenosis in 6 months.   URI symptoms. Discussed vitamin C and zinc. Can use tylenol cold sinus severe. Rest and hydrate. HO given. Call office if not improving or if symptoms worsening.   Xray to look for arthritis in left finger. Reassured I do not think actos is causing this. Ice and NSAID.    Follow up 3 months.

## 2018-06-16 ENCOUNTER — Telehealth: Payer: Self-pay

## 2018-06-16 MED ORDER — AZITHROMYCIN 250 MG PO TABS: ORAL_TABLET | ORAL | 0 refills | Status: DC

## 2018-06-16 NOTE — Telephone Encounter (Signed)
Pt called stating Connie Cardenas requested update..  Reports that her congestion is worse and is not green and bloody   Not sure about next steps. Please advise

## 2018-06-16 NOTE — Telephone Encounter (Signed)
Ok sent zpak to pharmacy.

## 2018-06-16 NOTE — Telephone Encounter (Signed)
Left pt msg advising RX at pharmacy.   Advised to call for appt if SX worsen or fail to improve

## 2018-09-07 ENCOUNTER — Other Ambulatory Visit: Payer: Self-pay | Admitting: Physician Assistant

## 2018-09-07 DIAGNOSIS — E119 Type 2 diabetes mellitus without complications: Secondary | ICD-10-CM

## 2018-09-10 ENCOUNTER — Ambulatory Visit (INDEPENDENT_AMBULATORY_CARE_PROVIDER_SITE_OTHER): Payer: BLUE CROSS/BLUE SHIELD

## 2018-09-10 ENCOUNTER — Encounter: Payer: Self-pay | Admitting: Physician Assistant

## 2018-09-10 ENCOUNTER — Ambulatory Visit (INDEPENDENT_AMBULATORY_CARE_PROVIDER_SITE_OTHER): Payer: BLUE CROSS/BLUE SHIELD | Admitting: Physician Assistant

## 2018-09-10 ENCOUNTER — Other Ambulatory Visit: Payer: Self-pay

## 2018-09-10 VITALS — BP 131/71 | HR 96 | Temp 97.9°F | Ht 65.0 in | Wt 192.0 lb

## 2018-09-10 DIAGNOSIS — M79645 Pain in left finger(s): Secondary | ICD-10-CM | POA: Diagnosis not present

## 2018-09-10 DIAGNOSIS — M7989 Other specified soft tissue disorders: Secondary | ICD-10-CM | POA: Diagnosis not present

## 2018-09-10 DIAGNOSIS — I10 Essential (primary) hypertension: Secondary | ICD-10-CM | POA: Diagnosis not present

## 2018-09-10 DIAGNOSIS — E119 Type 2 diabetes mellitus without complications: Secondary | ICD-10-CM

## 2018-09-10 LAB — POCT GLYCOSYLATED HEMOGLOBIN (HGB A1C): Hemoglobin A1C: 7.4 % — AB (ref 4.0–5.6)

## 2018-09-10 MED ORDER — LISINOPRIL 20 MG PO TABS: 20.0000 mg | ORAL_TABLET | Freq: Every day | ORAL | 1 refills | Status: DC

## 2018-09-10 MED ORDER — SEMAGLUTIDE(0.25 OR 0.5MG/DOS) 2 MG/1.5ML ~~LOC~~ SOPN: 0.5000 mg | PEN_INJECTOR | SUBCUTANEOUS | 0 refills | Status: DC

## 2018-09-10 MED ORDER — SITAGLIPTIN PHOSPHATE 100 MG PO TABS: 100.0000 mg | ORAL_TABLET | Freq: Every day | ORAL | 0 refills | Status: DC

## 2018-09-10 MED ORDER — METFORMIN HCL 1000 MG PO TABS: 1000.0000 mg | ORAL_TABLET | Freq: Two times a day (BID) | ORAL | 0 refills | Status: DC

## 2018-09-10 MED ORDER — PIOGLITAZONE HCL 30 MG PO TABS: 30.0000 mg | ORAL_TABLET | Freq: Every day | ORAL | 0 refills | Status: DC

## 2018-09-10 NOTE — Progress Notes (Signed)
No bony abnormalities. Soft tissue swelling seen..with no discomfort I would stop the actos just to see if that had anything to do with it.

## 2018-09-10 NOTE — Progress Notes (Signed)
Subjective:    Patient ID: Connie ReamerPatricia Cardenas, female    DOB: 07/21/1950, 68 y.o.   MRN: [id number]  HPI  Pt is a 68 yo female with T2DM, HTN, Carotid artery stenosis, HLD who presents to the clinic for 3 month follow up.   Pt is not checking her sugars. She is taking ozempic weekly and tolerating well. She continues on oral metformin, actos, januvia. No hypoglycemic events. No open sores or wounds. She is not keep a great diet during COVID pandemic. She has gained about 10lbs.   She continues to have left index finger swollen. Not red, painful, warm. No other joint effected. It has been that this for over 3 months. She thought it started after she start actos. No trauma.   .. Active Ambulatory Problems    Diagnosis Date Noted  . Dyslipidemia, goal LDL below 70 10/04/2015  . Type 2 diabetes mellitus without complication, without long-term current use of insulin (HCC) 10/04/2015  . Essential hypertension, benign 10/04/2015  . Obesity 01/04/2016  . Hyperlipidemia associated with type 2 diabetes mellitus (HCC) 04/14/2016  . Elevated liver enzymes 07/15/2017  . Systolic ejection murmur 07/15/2017  . Left carotid bruit 07/15/2017  . Liver cyst 04/08/2018  . Breast cyst, left 04/11/2018  . Moderate aortic valve stenosis 04/11/2018  . Bicuspid aortic valve 04/11/2018  . Carotid artery stenosis, asymptomatic, right 04/11/2018  . Swelling of left index finger 06/11/2018   Resolved Ambulatory Problems    Diagnosis Date Noted  . No Resolved Ambulatory Problems   Past Medical History:  Diagnosis Date  . Diabetes mellitus without complication (HCC)   . Hypertension      Review of Systems  All other systems reviewed and are negative.      Objective:   Physical Exam Vitals signs reviewed.  Constitutional:      Appearance: Normal appearance.  Cardiovascular:     Rate and Rhythm: Normal rate and regular rhythm.     Pulses: Normal pulses.     Heart sounds: Murmur present.      Comments: Systolic murmur 3/6. Pulmonary:     Effort: Pulmonary effort is normal.     Breath sounds: Normal breath sounds.  Musculoskeletal:     Comments: Left index finger swollen from MCP to PIP. Tenderness over the MCP to palpation. No warmth or redness.   Neurological:     General: No focal deficit present.     Mental Status: [name], place, and time.  Psychiatric:        Mood and Affect: Mood normal.        Behavior: Behavior normal.           Assessment & Plan:  Marland Kitchen.Marland Kitchen.Elease Hashimotoatricia was seen today for diabetes.  Diagnoses and all orders for this visit:  Type 2 diabetes mellitus without complication, without long-term current use of insulin (HCC) -     POCT glycosylated hemoglobin (Hb A1C) -     metFORMIN (GLUCOPHAGE) 1000 MG tablet; Take 1 tablet (1,000 mg total) by mouth 2 (two) times daily. -     pioglitazone (ACTOS) 30 MG tablet; Take 1 tablet (30 mg total) by mouth daily. -     Semaglutide,0.25 or 0.5MG /DOS, (OZEMPIC, 0.25 OR 0.5 MG/DOSE,) 2 MG/1.5ML SOPN; Inject 0.5 mg into the skin once a week. -     sitaGLIPtin (JANUVIA) 100 MG tablet; Take 1 tablet (100 mg total) by mouth daily.  Swelling of left index finger -  DG Finger Index Left  Essential hypertension -     lisinopril (ZESTRIL) 20 MG tablet; Take 1 tablet (20 mg total) by mouth daily.    .. Lab Results  Component Value Date   HGBA1C 7.4 (A) 09/10/2018   A!C is down from 8.  Discussed ozempic increase pt declined today. Continue on same medications.  On ACE.  Declined statin.  .. Diabetic Foot Exam - Simple   Simple Foot Form Diabetic Foot exam was performed with the following findings:  Yes 09/10/2018  7:27 AM  Visual Inspection No deformities, no ulcerations, no other skin breakdown bilaterally:  Yes Sensation Testing Intact to touch and monofilament testing bilaterally:  Yes See comments:  Yes Pulse Check Posterior Tibialis and Dorsalis pulse intact bilaterally:   Yes Comments Could not feel right heel monofilament testing    Right heel is callused.  Discussed diabetic diet.  Follow up in 3 months.   Left index finger remains swollen. Will get imaging today. Likely arthritis. Does not sound like RA with no warmth/redness. Pt feels like start with actos. If xray normal ok to stop actos for 2 weeks and see if any changes.

## 2018-12-02 ENCOUNTER — Other Ambulatory Visit: Payer: Self-pay | Admitting: Physician Assistant

## 2018-12-02 DIAGNOSIS — E119 Type 2 diabetes mellitus without complications: Secondary | ICD-10-CM

## 2018-12-17 ENCOUNTER — Ambulatory Visit: Payer: BLUE CROSS/BLUE SHIELD | Admitting: Physician Assistant

## 2018-12-28 ENCOUNTER — Other Ambulatory Visit: Payer: Self-pay | Admitting: Physician Assistant

## 2018-12-28 DIAGNOSIS — E119 Type 2 diabetes mellitus without complications: Secondary | ICD-10-CM

## 2018-12-30 NOTE — Telephone Encounter (Signed)
Called PT No answer. °

## 2018-12-30 NOTE — Telephone Encounter (Signed)
Please advise 

## 2018-12-30 NOTE — Telephone Encounter (Signed)
Needs appt. Sent another refill for now though.

## 2019-01-02 ENCOUNTER — Other Ambulatory Visit: Payer: Self-pay | Admitting: Physician Assistant

## 2019-01-02 DIAGNOSIS — E119 Type 2 diabetes mellitus without complications: Secondary | ICD-10-CM

## 2019-01-13 ENCOUNTER — Other Ambulatory Visit: Payer: Self-pay | Admitting: Physician Assistant

## 2019-01-13 DIAGNOSIS — E119 Type 2 diabetes mellitus without complications: Secondary | ICD-10-CM

## 2019-01-17 ENCOUNTER — Other Ambulatory Visit: Payer: Self-pay | Admitting: Physician Assistant

## 2019-01-17 DIAGNOSIS — E119 Type 2 diabetes mellitus without complications: Secondary | ICD-10-CM

## 2019-02-02 ENCOUNTER — Other Ambulatory Visit: Payer: Self-pay

## 2019-02-02 ENCOUNTER — Ambulatory Visit (INDEPENDENT_AMBULATORY_CARE_PROVIDER_SITE_OTHER): Payer: BC Managed Care – PPO | Admitting: Physician Assistant

## 2019-02-02 ENCOUNTER — Encounter: Payer: Self-pay | Admitting: Physician Assistant

## 2019-02-02 VITALS — BP 141/71 | HR 86 | Ht 65.0 in | Wt 191.0 lb

## 2019-02-02 DIAGNOSIS — E119 Type 2 diabetes mellitus without complications: Secondary | ICD-10-CM

## 2019-02-02 DIAGNOSIS — E1169 Type 2 diabetes mellitus with other specified complication: Secondary | ICD-10-CM

## 2019-02-02 DIAGNOSIS — Z008 Encounter for other general examination: Secondary | ICD-10-CM

## 2019-02-02 DIAGNOSIS — E785 Hyperlipidemia, unspecified: Secondary | ICD-10-CM | POA: Diagnosis not present

## 2019-02-02 DIAGNOSIS — Z20828 Contact with and (suspected) exposure to other viral communicable diseases: Secondary | ICD-10-CM

## 2019-02-02 DIAGNOSIS — I350 Nonrheumatic aortic (valve) stenosis: Secondary | ICD-10-CM

## 2019-02-02 DIAGNOSIS — I6521 Occlusion and stenosis of right carotid artery: Secondary | ICD-10-CM | POA: Diagnosis not present

## 2019-02-02 DIAGNOSIS — Z20822 Contact with and (suspected) exposure to covid-19: Secondary | ICD-10-CM

## 2019-02-02 DIAGNOSIS — I10 Essential (primary) hypertension: Secondary | ICD-10-CM

## 2019-02-02 LAB — POCT GLYCOSYLATED HEMOGLOBIN (HGB A1C): Hemoglobin A1C: 9.7 % — AB (ref 4.0–5.6)

## 2019-02-02 MED ORDER — METFORMIN HCL 1000 MG PO TABS: 1000.0000 mg | ORAL_TABLET | Freq: Two times a day (BID) | ORAL | 1 refills | Status: DC

## 2019-02-02 MED ORDER — PIOGLITAZONE HCL 45 MG PO TABS: 45.0000 mg | ORAL_TABLET | Freq: Every day | ORAL | 1 refills | Status: DC

## 2019-02-02 MED ORDER — ROSUVASTATIN CALCIUM 10 MG PO TABS: 10.0000 mg | ORAL_TABLET | Freq: Every day | ORAL | 3 refills | Status: DC

## 2019-02-02 MED ORDER — FLUCONAZOLE 150 MG PO TABS: 150.0000 mg | ORAL_TABLET | Freq: Once | ORAL | 0 refills | Status: AC

## 2019-02-02 MED ORDER — TRULICITY 1.5 MG/0.5ML ~~LOC~~ SOAJ: 1.5000 mg | SUBCUTANEOUS | 2 refills | Status: DC

## 2019-02-02 NOTE — Progress Notes (Signed)
Subjective:    Patient ID: Connie Cardenas, female    DOB: June 03, 1950, 68 y.o.   MRN: [id number]  HPI Pt is a 68 yo female with T2DM, Aortic stenosis, HLD, HTN, carotid stenosis who presents to the clinic for 3 month follow up.   Pt is doing ok. She admits she has not been as compliant with medication as she should. No CP, palpitations, edema, SOB, headaches.   She is not checking her sugars. She is not taking all her medications. She is not eating right and exercising. No open sores or wounds. No hypoglycemic events. Pt does have recurrent yeast infections. She has not had one since stopping faraxiga but has one today. Would like treatment.   .. Active Ambulatory Problems    Diagnosis Date Noted  . Dyslipidemia, goal LDL below 70 10/04/2015  . Type 2 diabetes mellitus without complication, without long-term current use of insulin (HCC) 10/04/2015  . Essential hypertension, benign 10/04/2015  . Obesity 01/04/2016  . Hyperlipidemia associated with type 2 diabetes mellitus (HCC) 04/14/2016  . Elevated liver enzymes 07/15/2017  . Systolic ejection murmur 07/15/2017  . Left carotid bruit 07/15/2017  . Liver cyst 04/08/2018  . Breast cyst, left 04/11/2018  . Moderate aortic valve stenosis 04/11/2018  . Bicuspid aortic valve 04/11/2018  . Carotid artery stenosis, asymptomatic, right 04/11/2018  . Swelling of left index finger 06/11/2018   Resolved Ambulatory Problems    Diagnosis Date Noted  . No Resolved Ambulatory Problems   Past Medical History:  Diagnosis Date  . Diabetes mellitus without complication (HCC)   . Hypertension       Review of Systems  All other systems reviewed and are negative.      Objective:   Physical Exam Vitals signs reviewed.  Constitutional:      Appearance: Normal appearance.  HENT:     Head: Normocephalic.  Cardiovascular:     Rate and Rhythm: Normal rate and regular rhythm.     Pulses: Normal pulses.     Heart sounds: Murmur present.   Pulmonary:     Effort: Pulmonary effort is normal.     Breath sounds: Normal breath sounds.  Musculoskeletal:     Right lower leg: No edema.     Left lower leg: No edema.  Neurological:     General: No focal deficit present.     Mental Status: [name], place, and time.  Psychiatric:        Mood and Affect: Mood normal.        Behavior: Behavior normal.           Assessment & Plan:  Marland KitchenMarland KitchenChantele was seen today for diabetes.  Diagnoses and all orders for this visit:  Type 2 diabetes mellitus without complication, without long-term current use of insulin (HCC) -     POCT glycosylated hemoglobin (Hb A1C) -     pioglitazone (ACTOS) 45 MG tablet; Take 1 tablet (45 mg total) by mouth daily. -     metFORMIN (GLUCOPHAGE) 1000 MG tablet; Take 1 tablet (1,000 mg total) by mouth 2 (two) times daily. -     Dulaglutide (TRULICITY) 1.5 MG/0.5ML SOPN; Inject 1.5 mg into the skin once a week. -     Lipid Panel w/reflex Direct LDL -     COMPLETE METABOLIC PANEL WITH GFR -     Hemoglobin A1c  Dyslipidemia (high LDL; low HDL) -     Lipid Panel w/reflex Direct LDL -  COMPLETE METABOLIC PANEL WITH GFR -     rosuvastatin (CRESTOR) 10 MG tablet; Take 1 tablet (10 mg total) by mouth daily.  Suspected COVID-19 virus infection -     SAR CoV2 Serology (COVID 19)AB(IGG)IA  Carotid artery stenosis, asymptomatic, right -     rosuvastatin (CRESTOR) 10 MG tablet; Take 1 tablet (10 mg total) by mouth daily.  Moderate aortic valve stenosis  Essential hypertension, benign  Hyperlipidemia associated with type 2 diabetes mellitus (HCC) -     rosuvastatin (CRESTOR) 10 MG tablet; Take 1 tablet (10 mg total) by mouth daily.  Other orders -     fluconazole (DIFLUCAN) 150 MG tablet; Take 1 tablet (150 mg total) by mouth once for 1 dose. Repeat in 48-72 hours if symptoms persist.   .. Results for orders placed or performed in visit on 02/02/19  POCT glycosylated hemoglobin  (Hb A1C)  Result Value Ref Range   Hemoglobin A1C 9.7 (A) 4.0 - 5.6 %   HbA1c POC (<> result, manual entry)     HbA1c, POC (prediabetic range)     HbA1c, POC (controlled diabetic range)     Make sure taking all medications.  Discussed diabetic diet.  Discussed insulin if we could not get sugars controlled with oral medication.  On STATIN.  On ACE. BP not controlled but patient did not take BP medication this monrning.  Flu and pneumonia vaccine declined.  Needs eye exam.   Due for repeat carotid u/s. Pt aware.

## 2019-02-02 NOTE — Patient Instructions (Signed)
Consider  Flu shot.  Pneumonia shot.  Shingles vaccine.   Call and reschedule carotid dopplers.

## 2019-02-03 ENCOUNTER — Ambulatory Visit: Payer: BC Managed Care – PPO | Admitting: Physician Assistant

## 2019-02-08 ENCOUNTER — Encounter: Payer: Self-pay | Admitting: Physician Assistant

## 2019-02-09 ENCOUNTER — Other Ambulatory Visit: Payer: Self-pay | Admitting: Physician Assistant

## 2019-02-09 DIAGNOSIS — E119 Type 2 diabetes mellitus without complications: Secondary | ICD-10-CM

## 2019-02-18 IMAGING — US US BREAST*L* LIMITED INC AXILLA
1 series · 10 of 10 positions shown · non-contrast
Comparison: Previous exam(s).

CLINICAL DATA: 67-year-old patient recalled recent screening
mammogram evaluation of 2 possible masses lower inner quadrant left
breast.

EXAM:
DIGITAL DIAGNOSTIC LEFT MAMMOGRAM WITH TOMO
ULTRASOUND LEFT BREAST

[Series 1: us breast*left* limited inc axilla · 0.06mm/px · 10 of 10 slices shown]
[im 1/10]
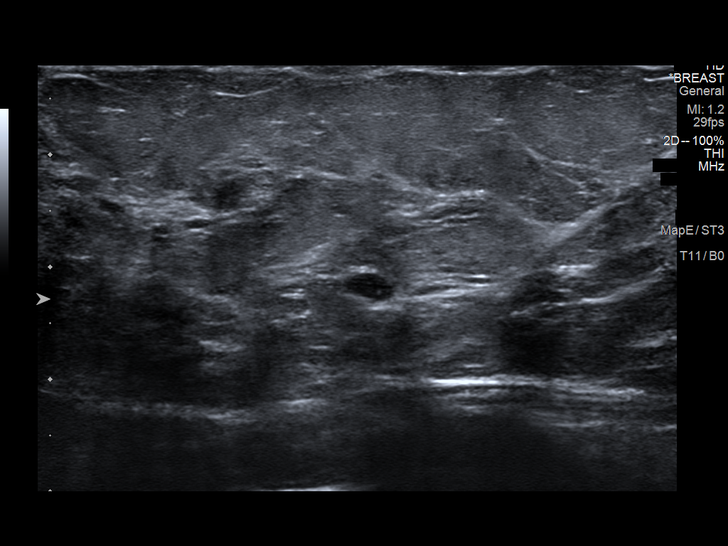
[im 2/10]
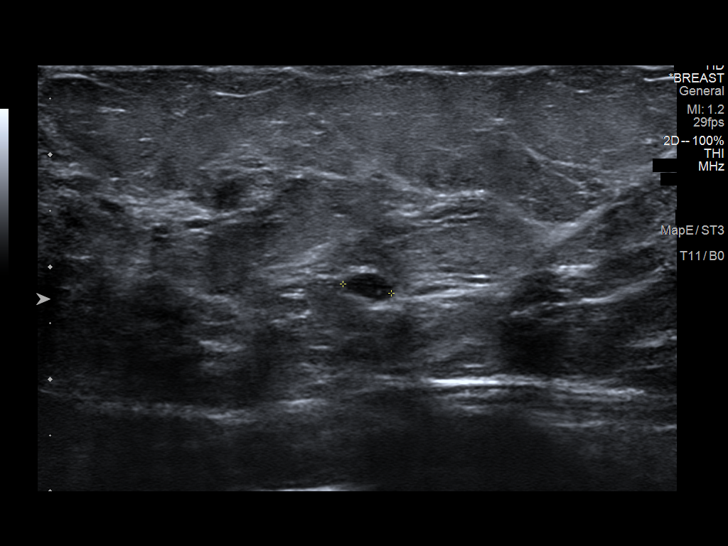
[im 3/10]
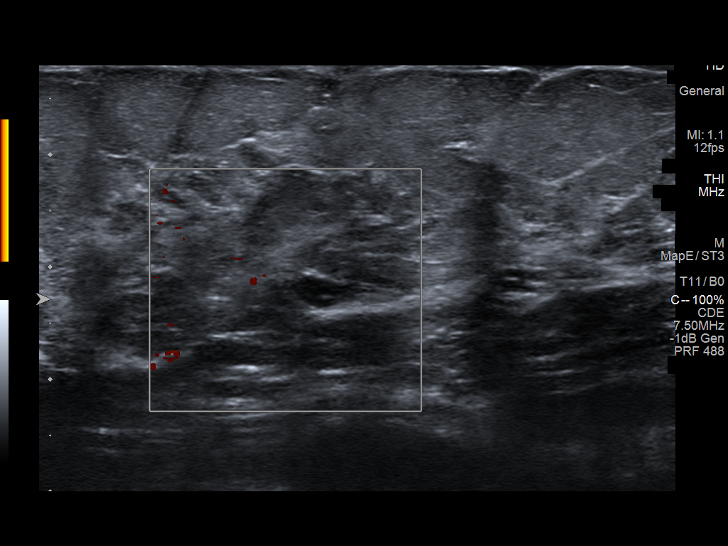
[im 4/10]
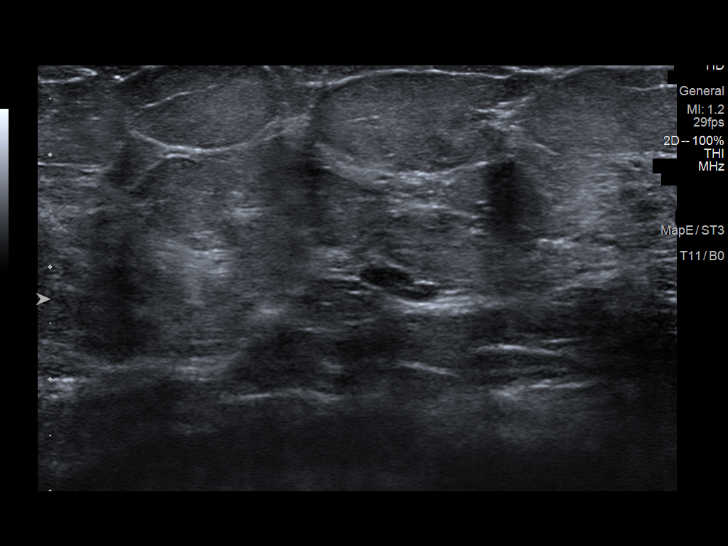
[im 5/10]
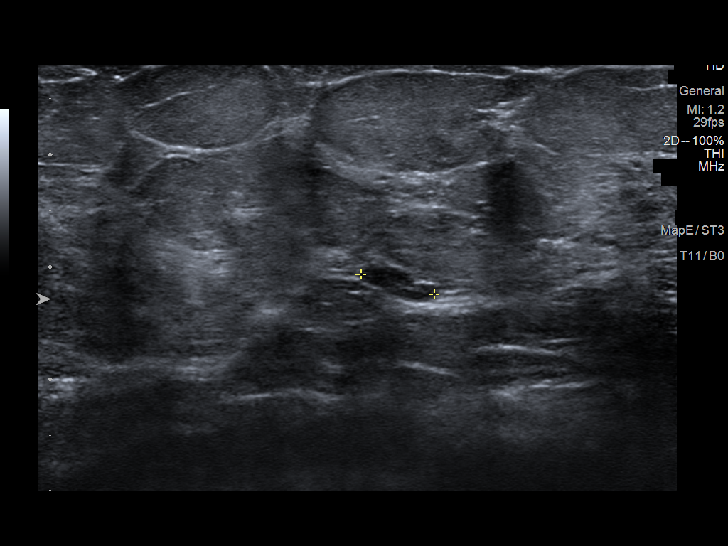
[im 6/10]
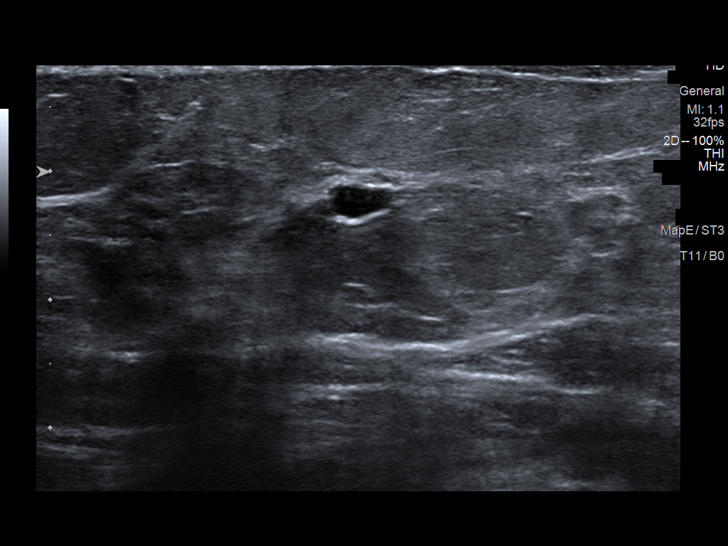
[im 7/10]
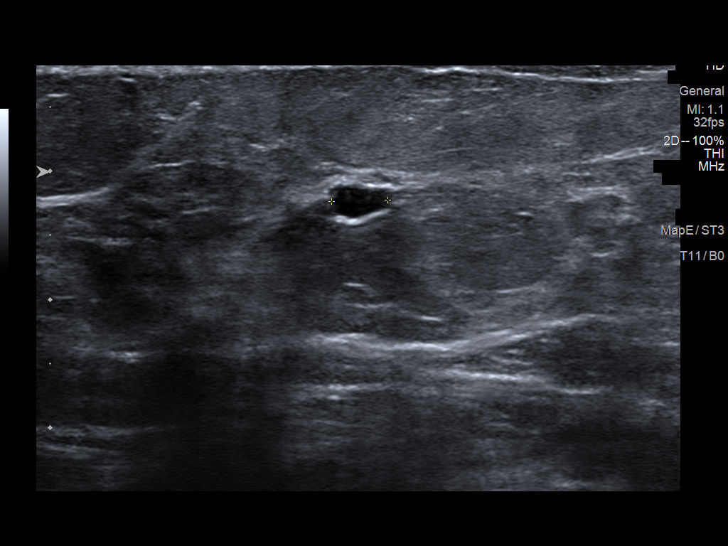
[im 8/10]
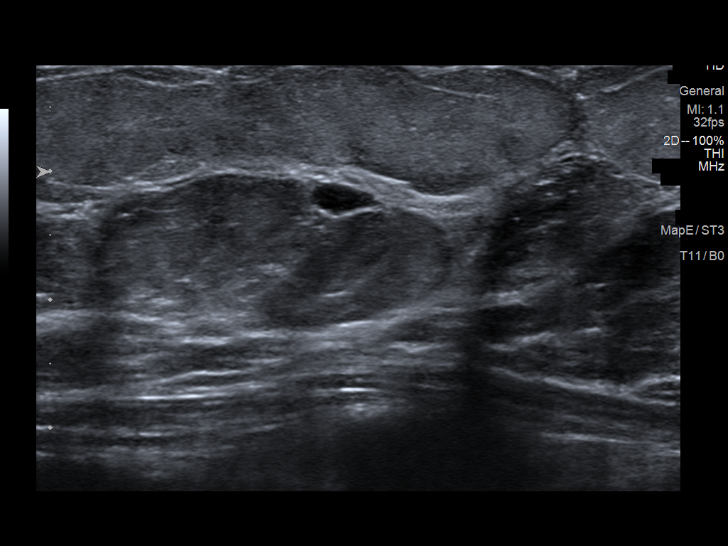
[im 9/10]
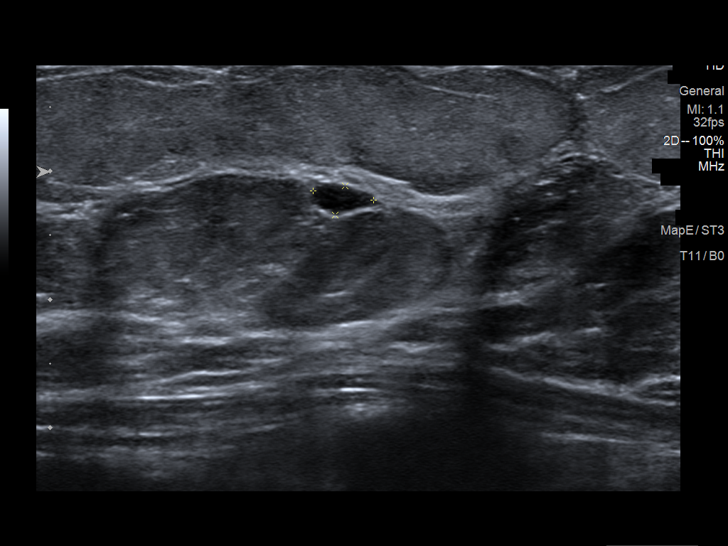
[im 10/10]
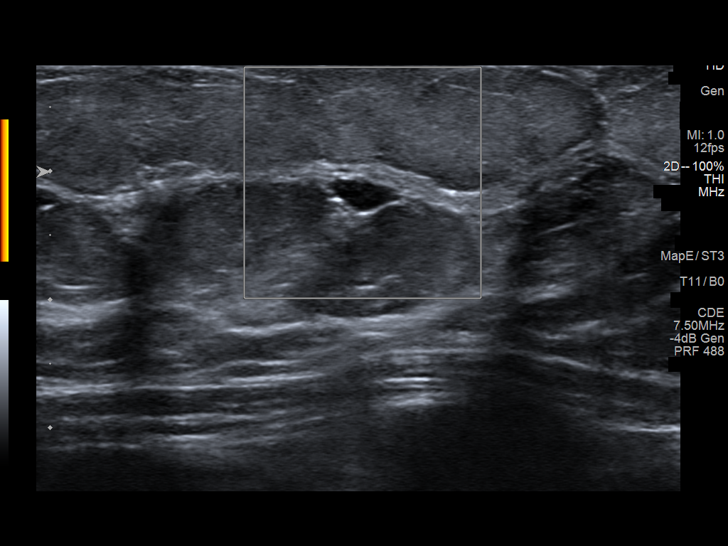

[10 of 10 positions shown; findings below may reference images not displayed]

ACR Breast Density Category b: There are scattered areas of
fibroglandular density.
FINDINGS: Spot compression views of the medial left breast confirm 2
low-density circumscribed oval gently lobulated masses, each
measuring approximately 0.5 to 0.6 cm.

Targeted ultrasound is performed, showing a 0.4 x 0.5 x 0.2 cm cyst
at 9 o'clock position 4 cm from the nipple and a 0.7 x 0.4 x 0.2 cm
cyst with a thin internal septation at 8 o'clock position 2 cm from
the nipple. These benign cysts correspond with the masses seen at
mammography. No solid or suspicious masses identified.
IMPRESSION: Two benign cysts in the left breast.  No evidence of malignancy.

RECOMMENDATION:
Screening mammogram in one year.(Code:GI-4-BIQ)

I have discussed the findings and recommendations with the patient.
Results were also provided in writing at the conclusion of the
visit. If applicable, a reminder letter will be sent to the patient
regarding the next appointment.

BI-RADS CATEGORY  2: Benign.

## 2019-03-01 ENCOUNTER — Other Ambulatory Visit: Payer: Self-pay | Admitting: Family Medicine

## 2019-03-01 ENCOUNTER — Other Ambulatory Visit: Payer: Self-pay | Admitting: Physician Assistant

## 2019-03-01 DIAGNOSIS — I10 Essential (primary) hypertension: Secondary | ICD-10-CM

## 2019-03-01 DIAGNOSIS — E119 Type 2 diabetes mellitus without complications: Secondary | ICD-10-CM

## 2019-04-22 ENCOUNTER — Ambulatory Visit (INDEPENDENT_AMBULATORY_CARE_PROVIDER_SITE_OTHER): Payer: Medicare Other | Admitting: Sports Medicine

## 2019-04-22 ENCOUNTER — Ambulatory Visit: Payer: Medicare Other | Attending: Internal Medicine

## 2019-04-22 DIAGNOSIS — Z7982 Long term (current) use of aspirin: Secondary | ICD-10-CM

## 2019-04-22 DIAGNOSIS — Z20828 Contact with and (suspected) exposure to other viral communicable diseases: Secondary | ICD-10-CM

## 2019-04-22 DIAGNOSIS — U071 COVID-19: Secondary | ICD-10-CM | POA: Insufficient documentation

## 2019-04-22 DIAGNOSIS — Z20822 Contact with and (suspected) exposure to covid-19: Secondary | ICD-10-CM

## 2019-04-22 MED ORDER — DEXAMETHASONE 4 MG PO TABS: 4.0000 mg | ORAL_TABLET | Freq: Three times a day (TID) | ORAL | 0 refills | Status: DC

## 2019-04-22 NOTE — Assessment & Plan Note (Signed)
Recent exposure to a patient with confirmed COVID-19, now with severe muscle aches, body aches, cough. No overt shortness of breath, speaking full sentences on the telephone. Adding Decadron. She has already been swabbed, awaiting results.

## 2019-04-22 NOTE — Progress Notes (Signed)
Virtual Visit via Telephone   I connected with  Connie Cardenas  on 04/22/19 by telephone/telehealth and verified that I am speaking with the correct person using two identifiers.   I discussed the limitations, risks, security and privacy concerns of performing an evaluation and management service by telephone, including the higher likelihood of inaccurate diagnosis and treatment, and the availability of in person appointments.  We also discussed the likely need of an additional face to face encounter for complete and high quality delivery of care.  I also discussed with the patient that there may be a patient responsible charge related to this service. The patient expressed understanding and wishes to proceed.  Provider location is either at home or medical facility. Patient location is at their home, different from provider location. People involved in care of the patient during this telehealth encounter were myself, my nurse/medical assistant, and my front office/scheduling team member.  Subjective:    CC: Possible Covid symptoms  HPI: Connie Cardenas came in contact with a positive confirmed Covid contact, she now has increasing cough, muscle aches, body aches, no fevers.  See below for further details.  I reviewed the past medical history, family history, social history, surgical history, and allergies today and no changes were needed.  Please see the problem list section below in epic for further details.  Past Medical History: Past Medical History:  Diagnosis Date  . Diabetes mellitus without complication (HCC)   . Hypertension    Past Surgical History: Past Surgical History:  Procedure Laterality Date  . TUBAL LIGATION     Social History: Social History   Socioeconomic History  . Marital status: Married    Spouse name: Not on file  . Number of children: Not on file  . Years of education: Not on file  . Highest education level: Not on file  Occupational History  . Not on  file  Tobacco Use  . Smoking status: Never Smoker  . Smokeless tobacco: Never Used  Substance and Sexual Activity  . Alcohol use: No    Alcohol/week: 0.0 standard drinks  . Drug use: No  . Sexual activity: Not Currently  Other Topics Concern  . Not on file  Social History Narrative  . Not on file   Social Determinants of Health   Financial Resource Strain:   . Difficulty of Paying Living Expenses: Not on file  Food Insecurity:   . Worried About Programme researcher, broadcasting/film/video in the Last Year: Not on file  . Ran Out of Food in the Last Year: Not on file  Transportation Needs:   . Lack of Transportation (Medical): Not on file  . Lack of Transportation (Non-Medical): Not on file  Physical Activity:   . Days of Exercise per Week: Not on file  . Minutes of Exercise per Session: Not on file  Stress:   . Feeling of Stress : Not on file  Social Connections:   . Frequency of Communication with Friends and Family: Not on file  . Frequency of Social Gatherings with Friends and Family: Not on file  . Attends Religious Services: Not on file  . Active Member of Clubs or Organizations: Not on file  . Attends Banker Meetings: Not on file  . Marital Status: Not on file   Family History: Family History  Problem Relation Age of Onset  . Cancer Sister        non-hodkins lymphoma  . Cancer Mother  colon  . Hypertension Mother   . Heart attack Father   . Hyperlipidemia Father   . Stroke Paternal Grandmother    Allergies: Allergies  Allergen Reactions  . Wilder Glade [Dapagliflozin]     Recurrent yeast infections.   . Penicillins    Medications: See med rec.  Review of Systems: No fevers, chills, night sweats, weight loss, chest pain, or shortness of breath.   Objective:    General: Speaking full sentences, no audible heavy breathing.  Sounds alert and appropriately interactive.  No other physical exam performed due to the non-face to face nature of this visit.   Impression and Recommendations:    COVID-19 Recent exposure to a patient with confirmed COVID-19, now with severe muscle aches, body aches, cough. No overt shortness of breath, speaking full sentences on the telephone. Adding Decadron. She has already been swabbed, awaiting results.  I discussed the above assessment and treatment plan with the patient. The patient was provided an opportunity to ask questions and all were answered. The patient agreed with the plan and demonstrated an understanding of the instructions.   The patient was advised to call back or seek an in-person evaluation if the symptoms worsen or if the condition fails to improve as anticipated.   I provided 25 minutes of non-face-to-face time during this encounter, 15 minutes of additional time was needed to gather information, review chart, records, communicate/coordinate with staff remotely, and complete documentation.   ___________________________________________ Gwen Her. Dianah Field, M.D., ABFM., CAQSM. Primary Care and Sports Medicine [facility]  Adjunct Professor of [employer] [employer] [facility] [employer]

## 2019-04-23 LAB — NOVEL CORONAVIRUS, NAA: SARS-CoV-2, NAA: DETECTED — AB

## 2019-05-05 ENCOUNTER — Ambulatory Visit (INDEPENDENT_AMBULATORY_CARE_PROVIDER_SITE_OTHER): Payer: Medicare Other | Admitting: Physician Assistant

## 2019-05-05 ENCOUNTER — Encounter: Payer: Self-pay | Admitting: Physician Assistant

## 2019-05-05 VITALS — BP 132/68 | HR 91 | Ht 65.0 in | Wt 187.0 lb

## 2019-05-05 DIAGNOSIS — I10 Essential (primary) hypertension: Secondary | ICD-10-CM

## 2019-05-05 DIAGNOSIS — E119 Type 2 diabetes mellitus without complications: Secondary | ICD-10-CM | POA: Diagnosis not present

## 2019-05-05 DIAGNOSIS — Z780 Asymptomatic menopausal state: Secondary | ICD-10-CM

## 2019-05-05 DIAGNOSIS — E785 Hyperlipidemia, unspecified: Secondary | ICD-10-CM

## 2019-05-05 DIAGNOSIS — E1169 Type 2 diabetes mellitus with other specified complication: Secondary | ICD-10-CM | POA: Diagnosis not present

## 2019-05-05 DIAGNOSIS — Z1231 Encounter for screening mammogram for malignant neoplasm of breast: Secondary | ICD-10-CM

## 2019-05-05 DIAGNOSIS — Z8616 Personal history of COVID-19: Secondary | ICD-10-CM | POA: Insufficient documentation

## 2019-05-05 DIAGNOSIS — Z13820 Encounter for screening for osteoporosis: Secondary | ICD-10-CM

## 2019-05-05 LAB — COMPLETE METABOLIC PANEL WITH GFR
AG Ratio: 1.8 (calc) (ref 1.0–2.5)
ALT: 39 U/L — ABNORMAL HIGH (ref 6–29)
AST: 27 U/L (ref 10–35)
Albumin: 4.4 g/dL (ref 3.6–5.1)
Alkaline phosphatase (APISO): 70 U/L (ref 37–153)
BUN: 17 mg/dL (ref 7–25)
CO2: 25 mmol/L (ref 20–32)
Calcium: 9.6 mg/dL (ref 8.6–10.4)
Chloride: 101 mmol/L (ref 98–110)
Creat: 0.69 mg/dL (ref 0.50–0.99)
GFR, Est African American: 105 mL/min/{1.73_m2} (ref >=60)
GFR, Est Non African American: 91 mL/min/{1.73_m2} (ref >=60)
Globulin: 2.5 g/dL (calc) (ref 1.9–3.7)
Glucose, Bld: 225 mg/dL — ABNORMAL HIGH (ref 65–99)
Potassium: 4.3 mmol/L (ref 3.5–5.3)
Sodium: 137 mmol/L (ref 135–146)
Total Bilirubin: 0.4 mg/dL (ref 0.2–1.2)
Total Protein: 6.9 g/dL (ref 6.1–8.1)

## 2019-05-05 LAB — LIPID PANEL W/REFLEX DIRECT LDL
Cholesterol: 254 mg/dL — ABNORMAL HIGH (ref <200)
HDL: 55 mg/dL (ref >=50)
LDL Cholesterol (Calc): 174 mg/dL (calc) — ABNORMAL HIGH
Non-HDL Cholesterol (Calc): 199 mg/dL (calc) — ABNORMAL HIGH (ref <130)
Total CHOL/HDL Ratio: 4.6 (calc) (ref <5.0)
Triglycerides: 119 mg/dL (ref <150)

## 2019-05-05 LAB — POCT GLYCOSYLATED HEMOGLOBIN (HGB A1C): Hemoglobin A1C: 8.7 % — AB (ref 4.0–5.6)

## 2019-05-05 MED ORDER — SITAGLIPTIN PHOSPHATE 100 MG PO TABS: 100.0000 mg | ORAL_TABLET | Freq: Every day | ORAL | 0 refills | Status: DC

## 2019-05-05 MED ORDER — LISINOPRIL 20 MG PO TABS: 20.0000 mg | ORAL_TABLET | Freq: Every day | ORAL | 3 refills | Status: DC

## 2019-05-05 MED ORDER — METFORMIN HCL 1000 MG PO TABS: 1000.0000 mg | ORAL_TABLET | Freq: Two times a day (BID) | ORAL | 1 refills | Status: DC

## 2019-05-05 NOTE — Patient Instructions (Addendum)
Add januvia back continue metformin and Actos.    Diabetes Mellitus and Nutrition, Adult When you have diabetes (diabetes mellitus), it is very important to have healthy eating habits because your blood sugar (glucose) levels are greatly affected by what you eat and drink. Eating healthy foods in the appropriate amounts, at about the same times every day, can help you:  Control your blood glucose.  Lower your risk of heart disease.  Improve your blood pressure.  Reach or maintain a healthy weight. Every person with diabetes is different, and each person has different needs for a meal plan. Your health care provider may recommend that you work with a diet and nutrition specialist (dietitian) to make a meal plan that is best for you. Your meal plan may vary depending on factors such as:  The calories you need.  The medicines you take.  Your weight.  Your blood glucose, blood pressure, and cholesterol levels.  Your activity level.  Other health conditions you have, such as heart or kidney disease. How do carbohydrates affect me? Carbohydrates, also called carbs, affect your blood glucose level more than any other type of food. Eating carbs naturally raises the amount of glucose in your blood. Carb counting is a method for keeping track of how many carbs you eat. Counting carbs is important to keep your blood glucose at a healthy level, especially if you use insulin or take certain oral diabetes medicines. It is important to know how many carbs you can safely have in each meal. This is different for every person. Your dietitian can help you calculate how many carbs you should have at each meal and for each snack. Foods that contain carbs include:  Bread, cereal, rice, pasta, and crackers.  Potatoes and corn.  Peas, beans, and lentils.  Milk and yogurt.  Fruit and juice.  Desserts, such as cakes, cookies, ice cream, and candy. How does alcohol affect me? Alcohol can cause a  sudden decrease in blood glucose (hypoglycemia), especially if you use insulin or take certain oral diabetes medicines. Hypoglycemia can be a life-threatening condition. Symptoms of hypoglycemia (sleepiness, dizziness, and confusion) are similar to symptoms of having too much alcohol. If your health care provider says that alcohol is safe for you, follow these guidelines:  Limit alcohol intake to no more than 1 drink per day for nonpregnant women and 2 drinks per day for men. One drink equals 12 oz of beer, 5 oz of wine, or 1 oz of hard liquor.  Do not drink on an empty stomach.  Keep yourself hydrated with water, diet soda, or unsweetened iced tea.  Keep in mind that regular soda, juice, and other mixers may contain a lot of sugar and must be counted as carbs. What are tips for following this plan?  Reading food labels  Start by checking the serving size on the "Nutrition Facts" label of packaged foods and drinks. The amount of calories, carbs, fats, and other nutrients listed on the label is based on one serving of the item. Many items contain more than one serving per package.  Check the total grams (g) of carbs in one serving. You can calculate the number of servings of carbs in one serving by dividing the total carbs by 15. For example, if a food has 30 g of total carbs, it would be equal to 2 servings of carbs.  Check the number of grams (g) of saturated and trans fats in one serving. Choose foods that have low or  no amount of these fats.  Check the number of milligrams (mg) of salt (sodium) in one serving. Most people should limit total sodium intake to less than 2,300 mg per day.  Always check the nutrition information of foods labeled as "low-fat" or "nonfat". These foods may be higher in added sugar or refined carbs and should be avoided.  Talk to your dietitian to identify your daily goals for nutrients listed on the label. Shopping  Avoid buying canned, premade, or processed  foods. These foods tend to be high in fat, sodium, and added sugar.  Shop around the outside edge of the grocery store. This includes fresh fruits and vegetables, bulk grains, fresh meats, and fresh dairy. Cooking  Use low-heat cooking methods, such as baking, instead of high-heat cooking methods like deep frying.  Cook using healthy oils, such as olive, canola, or sunflower oil.  Avoid cooking with butter, cream, or high-fat meats. Meal planning  Eat meals and snacks regularly, preferably at the same times every day. Avoid going long periods of time without eating.  Eat foods high in fiber, such as fresh fruits, vegetables, beans, and whole grains. Talk to your dietitian about how many servings of carbs you can eat at each meal.  Eat 4-6 ounces (oz) of lean protein each day, such as lean meat, chicken, fish, eggs, or tofu. One oz of lean protein is equal to: ? 1 oz of meat, chicken, or fish. ? 1 egg. ?  cup of tofu.  Eat some foods each day that contain healthy fats, such as avocado, nuts, seeds, and fish. Lifestyle  Check your blood glucose regularly.  Exercise regularly as told by your health care provider. This may include: ? 150 minutes of moderate-intensity or vigorous-intensity exercise each week. This could be brisk walking, biking, or water aerobics. ? Stretching and doing strength exercises, such as yoga or weightlifting, at least 2 times a week.  Take medicines as told by your health care provider.  Do not use any products that contain nicotine or tobacco, such as cigarettes and e-cigarettes. If you need help quitting, ask your health care provider.  Work with a Veterinary surgeon or diabetes educator to identify strategies to manage stress and any emotional and social challenges. Questions to ask a health care provider  Do I need to meet with a diabetes educator?  Do I need to meet with a dietitian?  What number can I call if I have questions?  When are the best times  to check my blood glucose? Where to find more information:  American Diabetes Association: diabetes.org  Academy of Nutrition and Dietetics: www.eatright.[employer] of Diabetes and Digestive and Kidney Diseases (NIH): CarFlippers.tn Summary  A healthy meal plan will help you control your blood glucose and maintain a healthy lifestyle.  Working with a diet and nutrition specialist (dietitian) can help you make a meal plan that is best for you.  Keep in mind that carbohydrates (carbs) and alcohol have immediate effects on your blood glucose levels. It is important to count carbs and to use alcohol carefully. This information is not intended to replace advice given to you by your health care provider. Make sure you discuss any questions you have with your health care provider. Document Revised: 03/22/2017 Document Reviewed: 05/14/2016 Elsevier Patient Education  2020 [employer].

## 2019-05-05 NOTE — Progress Notes (Signed)
Subjective:    Patient ID: Connie Cardenas, female    DOB: October 20, 1950, 69 y.o.   MRN: [id number]  HPI  Patient is a 69 year old female with type 2 diabetes, hypertension, hyperlipidemia who presents to the clinic for 26-month follow-up.  Patient is not checking her sugars regularly.  She denies any hypoglycemic events.  She denies any open sores or wounds.  She is trying to walk daily and only eats 2 meals a day with smaller portions.  She has lost a few pounds in the last 3 months.  She was not able to afford Trulicity and or Ozempic.  She has been taking Actos, Metformin daily.  Patient does report testing positive for Covid on 04/22/2019.  She is out of her quarantine and doing fairly well.  She did not have to go to the hospital and has managed all her symptoms with outpatient treatment.  She still has some lingering nasal congestion, headache, fatigue.  She feels like she is getting better.  She denies any significant shortness of breath.  She does have some chest pressure that is intermittent and when   .Marland Kitchen Active Ambulatory Problems    Diagnosis Date Noted  . Dyslipidemia, goal LDL below 70 10/04/2015  . Type 2 diabetes mellitus without complication, without long-term current use of insulin ([place]) 10/04/2015  . Essential hypertension, benign 10/04/2015  . Obesity 01/04/2016  . Hyperlipidemia associated with type 2 diabetes mellitus ([place]) 04/14/2016  . Elevated liver enzymes 07/15/2017  . Systolic ejection murmur 69/67/8938  . Left carotid bruit 07/15/2017  . Liver cyst 04/08/2018  . Breast cyst, left 04/11/2018  . Moderate aortic valve stenosis 04/11/2018  . Bicuspid aortic valve 04/11/2018  . Carotid artery stenosis, asymptomatic, right 04/11/2018  . Swelling of left index finger 06/11/2018  . History of COVID-19 05/05/2019   Resolved Ambulatory Problems    Diagnosis Date Noted  . COVID-19 04/22/2019   Past Medical History:  Diagnosis Date  . Diabetes mellitus without  complication ([place])   . Hypertension      Review of Systems See HPI>     Objective:   Physical Exam Vitals reviewed.  Constitutional:      Appearance: Normal appearance.  HENT:     Head: Normocephalic.     Right Ear: Tympanic membrane, ear canal and external ear normal.     Left Ear: Tympanic membrane, ear canal and external ear normal.     Nose: Nose normal.     Mouth/Throat:     Mouth: Mucous membranes are moist.  Cardiovascular:     Rate and Rhythm: Normal rate and regular rhythm.     Heart sounds: Murmur present.  Pulmonary:     Effort: Pulmonary effort is normal.     Breath sounds: Normal breath sounds.  Musculoskeletal:     Cervical back: Normal range of motion.  Lymphadenopathy:     Cervical: No cervical adenopathy.  Neurological:     General: No focal deficit present.     Mental Status: [name], place, and time.  Psychiatric:        Mood and Affect: Mood normal.           Assessment & Plan:  Marland KitchenMarland KitchenAudrea was seen today for diabetes.  Diagnoses and all orders for this visit:  Type 2 diabetes mellitus without complication, without long-term current use of insulin (HCC) -     POCT glycosylated hemoglobin (Hb A1C) -     Lipid Panel w/reflex  Direct LDL -     COMPLETE METABOLIC PANEL WITH GFR -     sitaGLIPtin (JANUVIA) 100 MG tablet; Take 1 tablet (100 mg total) by mouth daily. -     metFORMIN (GLUCOPHAGE) 1000 MG tablet; Take 1 tablet (1,000 mg total) by mouth 2 (two) times daily. -     Amb ref to Medical Nutrition Therapy/Nutritionist Connie Cardenas)  History of COVID-19  Essential hypertension, benign  Hyperlipidemia associated with type 2 diabetes mellitus (HCC)  Essential hypertension -     lisinopril (ZESTRIL) 20 MG tablet; Take 1 tablet (20 mg total) by mouth daily.  Visit for screening mammogram -     MM 3D SCREEN BREAST BILATERAL  Osteoporosis screening -     DG Bone Density  Post-menopausal -     DG Bone  Density   .Marland Kitchen Results for orders placed or performed in visit on 05/05/19  POCT glycosylated hemoglobin (Hb A1C)  Result Value Ref Range   Hemoglobin A1C 8.7 (A) 4.0 - 5.6 %   HbA1c POC (<> result, manual entry)     HbA1c, POC (prediabetic range)     HbA1c, POC (controlled diabetic range)     A1c has decreased from 9.7 however still not controlled. Patient did agree to nutrition referral to become more aware of foods that could be contributing to her hyperglycemia. Continue on Metformin, Actos.  I did go ahead and add Januvia since she was not able to afford Trulicity. On ACE.  Blood pressure to goal On statin will check lipid level today Encourage patient to make eye exam appointment Flu and pneumonia vaccine up-to-date  Due for bone density and screening mammogram.  Patient seems to be recovering well from Covid.  Continue symptomatic care.  Discussed it can take some time for the nasal congestion and post Covid fatigue to completely resolve.  Continue to stay hydrated and rest when needed. Could be some GERD when she lays down try pepcid or tums at bedtime.  Follow-up with any worsening symptoms or concerns.

## 2019-05-06 NOTE — Progress Notes (Signed)
Connie Cardenas,   Cholesterol is still very elevated and in combination with diabetes makes your risk of cardiovascular event very high. Goal LDL with diabetes is under 70 you are at 174. Are you taking crestor daily?   Liver enzymes have improved. Kidney function looks great.

## 2019-06-04 ENCOUNTER — Encounter: Payer: Self-pay | Admitting: Physician Assistant

## 2019-06-15 ENCOUNTER — Other Ambulatory Visit: Payer: Self-pay | Admitting: Physician Assistant

## 2019-06-15 DIAGNOSIS — E119 Type 2 diabetes mellitus without complications: Secondary | ICD-10-CM

## 2019-06-16 ENCOUNTER — Encounter: Payer: Self-pay | Admitting: Physician Assistant

## 2019-06-17 ENCOUNTER — Telehealth: Payer: Self-pay | Admitting: Physician Assistant

## 2019-06-17 NOTE — Telephone Encounter (Signed)
Received fax for PA Januvia sent through cover my meds waiting on determination. - CF

## 2019-06-18 NOTE — Telephone Encounter (Signed)
Received fax from [facility] and Alma Friend[place] is approved.   Refernce # [id number] Valid:  Through 04/22/2020 - CF

## 2019-06-18 NOTE — Telephone Encounter (Signed)
Done - CF °

## 2019-06-18 NOTE — Telephone Encounter (Signed)
Medication has been approved and I notified patient - Connie Cardenas

## 2019-06-22 ENCOUNTER — Encounter: Payer: Self-pay | Admitting: Physician Assistant

## 2019-06-22 NOTE — Telephone Encounter (Signed)
Who is doing prior authorizations? I think we should start there. This drug does not usually need PA.

## 2019-06-22 NOTE — Telephone Encounter (Signed)
Patient also left a vm about this. There is usually a tier exception request form that can be filled out like a prior authorization form to get patient medication cheaper.

## 2019-06-24 NOTE — Telephone Encounter (Signed)
I received auth for Januvia - CF

## 2019-06-29 ENCOUNTER — Encounter: Payer: Self-pay | Admitting: Physician Assistant

## 2019-07-03 ENCOUNTER — Telehealth: Payer: Self-pay | Admitting: Physician Assistant

## 2019-07-03 NOTE — Telephone Encounter (Signed)
Received a call from patient that even after medication was approved it was still too expensive and asked if we could do a tier exception.   I sent Tier exception for Januvia through cover my meds waiting on determination. - CF

## 2019-07-06 ENCOUNTER — Encounter: Payer: Self-pay | Admitting: Physician Assistant

## 2019-07-06 NOTE — Telephone Encounter (Signed)
I sent Connie Cardenas a message back letter her know that they denied the request for tier exception. - CF

## 2019-07-06 NOTE — Telephone Encounter (Signed)
I sent through cover my meds for Tier exception and they denied request I placed in providers box for review. - CF

## 2019-07-06 NOTE — Telephone Encounter (Signed)
Received fax from [facility] they denied Tier exception due to they have already made an exception to cover the drug from 06/17/19 - 04/22/20. They will not grant a lowering of the cost sharing amount. I am placing in providers box for review. - CF

## 2019-08-03 ENCOUNTER — Encounter: Payer: Self-pay | Admitting: Physician Assistant

## 2019-08-03 ENCOUNTER — Ambulatory Visit (INDEPENDENT_AMBULATORY_CARE_PROVIDER_SITE_OTHER): Payer: Medicare Other | Admitting: Physician Assistant

## 2019-08-03 VITALS — BP 120/51 | HR 85 | Ht 65.0 in | Wt 193.0 lb

## 2019-08-03 DIAGNOSIS — I10 Essential (primary) hypertension: Secondary | ICD-10-CM

## 2019-08-03 DIAGNOSIS — I350 Nonrheumatic aortic (valve) stenosis: Secondary | ICD-10-CM

## 2019-08-03 DIAGNOSIS — I6521 Occlusion and stenosis of right carotid artery: Secondary | ICD-10-CM

## 2019-08-03 DIAGNOSIS — E1169 Type 2 diabetes mellitus with other specified complication: Secondary | ICD-10-CM

## 2019-08-03 DIAGNOSIS — E785 Hyperlipidemia, unspecified: Secondary | ICD-10-CM

## 2019-08-03 DIAGNOSIS — Z139 Encounter for screening, unspecified: Secondary | ICD-10-CM

## 2019-08-03 DIAGNOSIS — E119 Type 2 diabetes mellitus without complications: Secondary | ICD-10-CM

## 2019-08-03 DIAGNOSIS — G8929 Other chronic pain: Secondary | ICD-10-CM

## 2019-08-03 DIAGNOSIS — M25511 Pain in right shoulder: Secondary | ICD-10-CM

## 2019-08-03 LAB — POCT GLYCOSYLATED HEMOGLOBIN (HGB A1C): Hemoglobin A1C: 8.5 % — AB (ref 4.0–5.6)

## 2019-08-03 MED ORDER — METFORMIN HCL 1000 MG PO TABS: 1000.0000 mg | ORAL_TABLET | Freq: Two times a day (BID) | ORAL | 1 refills | Status: DC

## 2019-08-03 MED ORDER — PIOGLITAZONE HCL 45 MG PO TABS: 45.0000 mg | ORAL_TABLET | Freq: Every day | ORAL | 1 refills | Status: DC

## 2019-08-03 MED ORDER — TRULICITY 0.75 MG/0.5ML ~~LOC~~ SOAJ: 0.7500 mg | SUBCUTANEOUS | 0 refills | Status: DC
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
## 2019-08-03 NOTE — Patient Instructions (Addendum)
Decrease lisinopril to 1/2 tablet daily due to low blood pressure.  Added trulicity weekly   Diabetes Mellitus and Exercise Exercising regularly is important for your overall health, especially when you have diabetes (diabetes mellitus). Exercising is not only about losing weight. It has many other health benefits, such as increasing muscle strength and bone density and reducing body fat and stress. This leads to improved fitness, flexibility, and endurance, all of which result in better overall health. Exercise has additional benefits for people with diabetes, including:  Reducing appetite.  Helping to lower and control blood glucose.  Lowering blood pressure.  Helping to control amounts of fatty substances (lipids) in the blood, such as cholesterol and triglycerides.  Helping the body to respond better to insulin (improving insulin sensitivity).  Reducing how much insulin the body needs.  Decreasing the risk for heart disease by: ? Lowering cholesterol and triglyceride levels. ? Increasing the levels of good cholesterol. ? Lowering blood glucose levels. What is my activity plan? Your health care provider or certified diabetes educator can help you make a plan for the type and frequency of exercise (activity plan) that works for you. Make sure that you:  Do at least 150 minutes of moderate-intensity or vigorous-intensity exercise each week. This could be brisk walking, biking, or water aerobics. ? Do stretching and strength exercises, such as yoga or weightlifting, at least 2 times a week. ? Spread out your activity over at least 3 days of the week.  Get some form of physical activity every day. ? Do not go more than 2 days in a row without some kind of physical activity. ? Avoid being inactive for more than 30 minutes at a time. Take frequent breaks to walk or stretch.  Choose a type of exercise or activity that you enjoy, and set realistic goals.  Start slowly, and gradually  increase the intensity of your exercise over time. What do I need to know about managing my diabetes?   Check your blood glucose before and after exercising. ? If your blood glucose is 240 mg/dL (09.7 mmol/L) or higher before you exercise, check your urine for ketones. If you have ketones in your urine, do not exercise until your blood glucose returns to normal. ? If your blood glucose is 100 mg/dL (5.6 mmol/L) or lower, eat a snack containing 15-20 grams of carbohydrate. Check your blood glucose 15 minutes after the snack to make sure that your level is above 100 mg/dL (5.6 mmol/L) before you start your exercise.  Know the symptoms of low blood glucose (hypoglycemia) and how to treat it. Your risk for hypoglycemia increases during and after exercise. Common symptoms of hypoglycemia can include: ? Hunger. ? Anxiety. ? Sweating and feeling clammy. ? Confusion. ? Dizziness or feeling light-headed. ? Increased heart rate or palpitations. ? Blurry vision. ? Tingling or numbness around the mouth, lips, or tongue. ? Tremors or shakes. ? Irritability.  Keep a rapid-acting carbohydrate snack available before, during, and after exercise to help prevent or treat hypoglycemia.  Avoid injecting insulin into areas of the body that are going to be exercised. For example, avoid injecting insulin into: ? The arms, when playing tennis. ? The legs, when jogging.  Keep records of your exercise habits. Doing this can help you and your health care provider adjust your diabetes management plan as needed. Write down: ? Food that you eat before and after you exercise. ? Blood glucose levels before and after you exercise. ? The type and  amount of exercise you have done. ? When your insulin is expected to peak, if you use insulin. Avoid exercising at times when your insulin is peaking.  When you start a new exercise or activity, work with your health care provider to make sure the activity is safe for you, and  to adjust your insulin, medicines, or food intake as needed.  Drink plenty of water while you exercise to prevent dehydration or heat stroke. Drink enough fluid to keep your urine clear or pale yellow. Summary  Exercising regularly is important for your overall health, especially when you have diabetes (diabetes mellitus).  Exercising has many health benefits, such as increasing muscle strength and bone density and reducing body fat and stress.  Your health care provider or certified diabetes educator can help you make a plan for the type and frequency of exercise (activity plan) that works for you.  When you start a new exercise or activity, work with your health care provider to make sure the activity is safe for you, and to adjust your insulin, medicines, or food intake as needed. This information is not intended to replace advice given to you by your health care provider. Make sure you discuss any questions you have with your health care provider. Document Revised: 11/01/2016 Document Reviewed: 09/19/2015 Elsevier Patient Education  2020 [employer]  Shoulder Exercises Ask your health care provider which exercises are safe for you. Do exercises exactly as told by your health care provider and adjust them as directed. It is normal to feel mild stretching, pulling, tightness, or discomfort as you do these exercises. Stop right away if you feel sudden pain or your pain gets worse. Do not begin these exercises until told by your health care provider. Stretching exercises External rotation and abduction This exercise is sometimes called corner stretch. This exercise rotates your arm outward (external rotation) and moves your arm out from your body (abduction). 1. Stand in a doorway with one of your feet slightly in front of the other. This is called a staggered stance. If you cannot reach your forearms to the door frame, stand facing a corner of a room. 2. Choose one of the following  positions as told by your health care provider: ? Place your hands and forearms on the door frame above your head. ? Place your hands and forearms on the door frame at the height of your head. ? Place your hands on the door frame at the height of your elbows. 3. Slowly move your weight onto your front foot until you feel a stretch across your chest and in the front of your shoulders. Keep your head and chest upright and keep your abdominal muscles tight. 4. Hold for __________ seconds. 5. To release the stretch, shift your weight to your back foot. Repeat __________ times. Complete this exercise __________ times a day. Extension, standing 1. Stand and hold a broomstick, a cane, or a similar object behind your back. ? Your hands should be a little wider than shoulder width apart. ? Your palms should face away from your back. 2. Keeping your elbows straight and your shoulder muscles relaxed, move the stick away from your body until you feel a stretch in your shoulders (extension). ? Avoid shrugging your shoulders while you move the stick. Keep your shoulder blades tucked down toward the middle of your back. 3. Hold for __________ seconds. 4. Slowly return to the starting position. Repeat __________ times. Complete this exercise __________ times a day. Range-of-motion exercises  Pendulum  1. Stand near a wall or a surface that you can hold onto for balance. 2. Bend at the waist and let your left / right arm hang straight down. Use your other arm to support you. Keep your back straight and do not lock your knees. 3. Relax your left / right arm and shoulder muscles, and move your hips and your trunk so your left / right arm swings freely. Your arm should swing because of the motion of your body, not because you are using your arm or shoulder muscles. 4. Keep moving your hips and trunk so your arm swings in the following directions, as told by your health care provider: ? Side to side. ? Forward  and backward. ? In clockwise and counterclockwise circles. 5. Continue each motion for __________ seconds, or for as long as told by your health care provider. 6. Slowly return to the starting position. Repeat __________ times. Complete this exercise __________ times a day. Shoulder flexion, standing  1. Stand and hold a broomstick, a cane, or a similar object. Place your hands a little more than shoulder width apart on the object. Your left / right hand should be palm up, and your other hand should be palm down. 2. Keep your elbow straight and your shoulder muscles relaxed. Push the stick up with your healthy arm to raise your left / right arm in front of your body, and then over your head until you feel a stretch in your shoulder (flexion). ? Avoid shrugging your shoulder while you raise your arm. Keep your shoulder blade tucked down toward the middle of your back. 3. Hold for __________ seconds. 4. Slowly return to the starting position. Repeat __________ times. Complete this exercise __________ times a day. Shoulder abduction, standing 1. Stand and hold a broomstick, a cane, or a similar object. Place your hands a little more than shoulder width apart on the object. Your left / right hand should be palm up, and your other hand should be palm down. 2. Keep your elbow straight and your shoulder muscles relaxed. Push the object across your body toward your left / right side. Raise your left / right arm to the side of your body (abduction) until you feel a stretch in your shoulder. ? Do not raise your arm above shoulder height unless your health care provider tells you to do that. ? If directed, raise your arm over your head. ? Avoid shrugging your shoulder while you raise your arm. Keep your shoulder blade tucked down toward the middle of your back. 3. Hold for __________ seconds. 4. Slowly return to the starting position. Repeat __________ times. Complete this exercise __________ times a  day. Internal rotation  1. Place your left / right hand behind your back, palm up. 2. Use your other hand to dangle an exercise band, a towel, or a similar object over your shoulder. Grasp the band with your left / right hand so you are holding on to both ends. 3. Gently pull up on the band until you feel a stretch in the front of your left / right shoulder. The movement of your arm toward the center of your body is called internal rotation. ? Avoid shrugging your shoulder while you raise your arm. Keep your shoulder blade tucked down toward the middle of your back. 4. Hold for __________ seconds. 5. Release the stretch by letting go of the band and lowering your hands. Repeat __________ times. Complete this exercise __________ times a day.  Strengthening exercises External rotation  1. Sit in a stable chair without armrests. 2. Secure an exercise band to a stable object at elbow height on your left / right side. 3. Place a soft object, such as a folded towel or a small pillow, between your left / right upper arm and your body to move your elbow about 4 inches (10 cm) away from your side. 4. Hold the end of the exercise band so it is tight and there is no slack. 5. Keeping your elbow pressed against the soft object, slowly move your forearm out, away from your abdomen (external rotation). Keep your body steady so only your forearm moves. 6. Hold for __________ seconds. 7. Slowly return to the starting position. Repeat __________ times. Complete this exercise __________ times a day. Shoulder abduction  1. Sit in a stable chair without armrests, or stand up. 2. Hold a __________ weight in your left / right hand, or hold an exercise band with both hands. 3. Start with your arms straight down and your left / right palm facing in, toward your body. 4. Slowly lift your left / right hand out to your side (abduction). Do not lift your hand above shoulder height unless your health care provider tells  you that this is safe. ? Keep your arms straight. ? Avoid shrugging your shoulder while you do this movement. Keep your shoulder blade tucked down toward the middle of your back. 5. Hold for __________ seconds. 6. Slowly lower your arm, and return to the starting position. Repeat __________ times. Complete this exercise __________ times a day. Shoulder extension 1. Sit in a stable chair without armrests, or stand up. 2. Secure an exercise band to a stable object in front of you so it is at shoulder height. 3. Hold one end of the exercise band in each hand. Your palms should face each other. 4. Straighten your elbows and lift your hands up to shoulder height. 5. Step back, away from the secured end of the exercise band, until the band is tight and there is no slack. 6. Squeeze your shoulder blades together as you pull your hands down to the sides of your thighs (extension). Stop when your hands are straight down by your sides. Do not let your hands go behind your body. 7. Hold for __________ seconds. 8. Slowly return to the starting position. Repeat __________ times. Complete this exercise __________ times a day. Shoulder row 1. Sit in a stable chair without armrests, or stand up. 2. Secure an exercise band to a stable object in front of you so it is at waist height. 3. Hold one end of the exercise band in each hand. Position your palms so that your thumbs are facing the ceiling (neutral position). 4. Bend each of your elbows to a 90-degree angle (right angle) and keep your upper arms at your sides. 5. Step back until the band is tight and there is no slack. 6. Slowly pull your elbows back behind you. 7. Hold for __________ seconds. 8. Slowly return to the starting position. Repeat __________ times. Complete this exercise __________ times a day. Shoulder press-ups  1. Sit in a stable chair that has armrests. Sit upright, with your feet flat on the floor. 2. Put your hands on the armrests  so your elbows are bent and your fingers are pointing forward. Your hands should be about even with the sides of your body. 3. Push down on the armrests and use your arms to lift yourself  off the chair. Straighten your elbows and lift yourself up as much as you comfortably can. ? Move your shoulder blades down, and avoid letting your shoulders move up toward your ears. ? Keep your feet on the ground. As you get stronger, your feet should support less of your body weight as you lift yourself up. 4. Hold for __________ seconds. 5. Slowly lower yourself back into the chair. Repeat __________ times. Complete this exercise __________ times a day. Wall push-ups  1. Stand so you are facing a stable wall. Your feet should be about one arm-length away from the wall. 2. Lean forward and place your palms on the wall at shoulder height. 3. Keep your feet flat on the floor as you bend your elbows and lean forward toward the wall. 4. Hold for __________ seconds. 5. Straighten your elbows to push yourself back to the starting position. Repeat __________ times. Complete this exercise __________ times a day. This information is not intended to replace advice given to you by your health care provider. Make sure you discuss any questions you have with your health care provider. Document Revised: 08/01/2018 Document Reviewed: 05/09/2018 Elsevier Patient Education  [employer].

## 2019-08-10 ENCOUNTER — Ambulatory Visit
Admission: RE | Admit: 2019-08-10 | Discharge: 2019-08-10 | Disposition: A | Discharge status: Home / Self Care | Payer: Medicare Other | Source: Ambulatory Visit | Attending: Physician Assistant | Admitting: Physician Assistant

## 2019-08-10 ENCOUNTER — Other Ambulatory Visit: Payer: Self-pay

## 2019-08-10 NOTE — Progress Notes (Signed)
Normal mammogram. Follow up in one year.

## 2019-08-11 NOTE — Progress Notes (Signed)
Connie Bible,   GREAT news. Bone density is normal. Continue on vitamin D 1000units and calcium 1200mg  daily. We can repeat in 2-10 years depending on any risk factors or concerns.   -4-10

## 2019-08-14 ENCOUNTER — Other Ambulatory Visit: Payer: Self-pay | Admitting: Physician Assistant

## 2019-08-14 DIAGNOSIS — E119 Type 2 diabetes mellitus without complications: Secondary | ICD-10-CM

## 2019-09-17 NOTE — Telephone Encounter (Signed)
Received authorization in February for non formulary exception valid through 04/22/2020. - [facility]

## 2019-10-30 LAB — HM DIABETES EYE EXAM

## 2019-11-02 ENCOUNTER — Ambulatory Visit: Payer: Medicare Other | Admitting: Physician Assistant

## 2019-11-11 ENCOUNTER — Ambulatory Visit (INDEPENDENT_AMBULATORY_CARE_PROVIDER_SITE_OTHER): Payer: Medicare Other

## 2019-11-11 ENCOUNTER — Other Ambulatory Visit: Payer: Self-pay

## 2019-11-11 ENCOUNTER — Ambulatory Visit (INDEPENDENT_AMBULATORY_CARE_PROVIDER_SITE_OTHER): Payer: Medicare Other | Admitting: Physician Assistant

## 2019-11-11 ENCOUNTER — Encounter: Payer: Self-pay | Admitting: Physician Assistant

## 2019-11-11 VITALS — BP 118/59 | HR 94 | Ht 65.0 in | Wt 194.0 lb

## 2019-11-11 DIAGNOSIS — I6521 Occlusion and stenosis of right carotid artery: Secondary | ICD-10-CM | POA: Diagnosis not present

## 2019-11-11 DIAGNOSIS — G8929 Other chronic pain: Secondary | ICD-10-CM

## 2019-11-11 DIAGNOSIS — I10 Essential (primary) hypertension: Secondary | ICD-10-CM

## 2019-11-11 DIAGNOSIS — E785 Hyperlipidemia, unspecified: Secondary | ICD-10-CM

## 2019-11-11 DIAGNOSIS — E119 Type 2 diabetes mellitus without complications: Secondary | ICD-10-CM

## 2019-11-11 DIAGNOSIS — E1169 Type 2 diabetes mellitus with other specified complication: Secondary | ICD-10-CM | POA: Diagnosis not present

## 2019-11-11 DIAGNOSIS — M25511 Pain in right shoulder: Secondary | ICD-10-CM

## 2019-11-11 LAB — POCT GLYCOSYLATED HEMOGLOBIN (HGB A1C): Hemoglobin A1C: 7.5 % — AB (ref 4.0–5.6)

## 2019-11-11 MED ORDER — DICLOFENAC SODIUM 1 % EX GEL: 4.0000 g | Freq: Four times a day (QID) | CUTANEOUS | 11 refills | Status: DC

## 2019-11-11 MED ORDER — PIOGLITAZONE HCL 45 MG PO TABS: 45.0000 mg | ORAL_TABLET | Freq: Every day | ORAL | 1 refills | Status: DC

## 2019-11-11 MED ORDER — METFORMIN HCL 1000 MG PO TABS: 1000.0000 mg | ORAL_TABLET | Freq: Two times a day (BID) | ORAL | 1 refills | Status: DC

## 2019-11-11 MED ORDER — TRULICITY 1.5 MG/0.5ML ~~LOC~~ SOAJ: 1.5000 mg | SUBCUTANEOUS | 2 refills | Status: DC

## 2019-11-11 MED ORDER — LISINOPRIL 5 MG PO TABS: 5.0000 mg | ORAL_TABLET | Freq: Every day | ORAL | 3 refills | Status: DC

## 2019-11-11 NOTE — Progress Notes (Signed)
Subjective:    Patient ID: Connie Cardenas, female    DOB: 12-Aug-1950, 70 y.o.   MRN: [id number]  HPI  Pt is a 69 yo female with T2DM, HTN, HLD, moderate aortic valve stenosis who presents to the clinic for 3 month follow up.   Pt is doing ok. She continues to be tired a lot and have right shoulder pain. Her fatigue is not every day but most days. She admits she has some trouble going to sleep at times.   She denies any right shoulder injury and has been doing stretches given to her last office visit which seemed to help some.  She also feels like heat helps.  She feels like her range of motion continues to worsen however.  She has not had an x-ray.  She is not checking her sugars.  She is compliant with her medications.  She denies any hypoglycemic events.  She denies any open sores or wounds.  She denies any lower extremity edema or shortness of breath or orthopnea.  .. Active Ambulatory Problems    Diagnosis Date Noted  . Dyslipidemia, goal LDL below 70 10/04/2015  . Type 2 diabetes mellitus without complication, without long-term current use of insulin (HCC) 10/04/2015  . Essential hypertension, benign 10/04/2015  . Obesity 01/04/2016  . Hyperlipidemia associated with type 2 diabetes mellitus (HCC) 04/14/2016  . Elevated liver enzymes 07/15/2017  . Systolic ejection murmur 07/15/2017  . Left carotid bruit 07/15/2017  . Liver cyst 04/08/2018  . Breast cyst, left 04/11/2018  . Moderate aortic valve stenosis 04/11/2018  . Bicuspid aortic valve 04/11/2018  . Carotid artery stenosis, asymptomatic, right 04/11/2018  . Swelling of left index finger 06/11/2018  . History of COVID-19 05/05/2019  . Chronic right shoulder pain 08/03/2019   Resolved Ambulatory Problems    Diagnosis Date Noted  . COVID-19 04/22/2019   Past Medical History:  Diagnosis Date  . Diabetes mellitus without complication (HCC)   . Hypertension    .Marland Kitchen Family History  Problem Relation Age of Onset   . Cancer Sister        non-hodkins lymphoma  . Cancer Mother        colon  . Hypertension Mother   . Heart attack Father   . Hyperlipidemia Father   . Stroke Paternal Grandmother      Review of Systems  All other systems reviewed and are negative.      Objective:   Physical Exam Vitals reviewed.  Constitutional:      Appearance: Normal appearance.  HENT:     Head: Normocephalic.  Cardiovascular:     Rate and Rhythm: Normal rate and regular rhythm.     Pulses: Normal pulses.     Heart sounds: Murmur heard.   Pulmonary:     Effort: Pulmonary effort is normal.     Breath sounds: Normal breath sounds.  Musculoskeletal:     Cervical back: Normal range of motion.     Comments: Right shoulder: Decreased abduction to 110 degrees due to pain.  Negative drop arm sign.  Pain with external and internal ROM.  Positive hawkins.  No tenderness over bicep tendon insertion or anterior shoulder.  Some pain over AC joint to palpation.  Strength 5/5.   Neurological:     General: No focal deficit present.     Mental Status: [name], place, and time.  Psychiatric:        Mood and Affect: Mood normal.  Assessment & Plan:  Marland KitchenMarland KitchenGinia was seen today for diabetes.  Diagnoses and all orders for this visit:  Type 2 diabetes mellitus without complication, without long-term current use of insulin (HCC) -     POCT glycosylated hemoglobin (Hb A1C) -     Dulaglutide (TRULICITY) 1.5 MG/0.5ML SOPN; Inject 0.5 mLs (1.5 mg total) into the skin once a week. -     Lipid Panel w/reflex Direct LDL -     COMPLETE METABOLIC PANEL WITH GFR -     CBC -     lisinopril (ZESTRIL) 5 MG tablet; Take 1 tablet (5 mg total) by mouth daily. -     pioglitazone (ACTOS) 45 MG tablet; Take 1 tablet (45 mg total) by mouth daily. -     metFORMIN (GLUCOPHAGE) 1000 MG tablet; Take 1 tablet (1,000 mg total) by mouth 2 (two) times daily.  Hyperlipidemia associated with type  2 diabetes mellitus (HCC) -     Lipid Panel w/reflex Direct LDL  Dyslipidemia, goal LDL below 70 -     Lipid Panel w/reflex Direct LDL  Chronic right shoulder pain -     DG Shoulder Right -     diclofenac Sodium (VOLTAREN) 1 % GEL; Apply 4 g topically 4 (four) times daily. To affected joint.  Essential hypertension -     lisinopril (ZESTRIL) 5 MG tablet; Take 1 tablet (5 mg total) by mouth daily.    Results for orders placed or performed in visit on 11/11/19  POCT glycosylated hemoglobin (Hb A1C)  Result Value Ref Range   Hemoglobin A1C 7.5 (A) 4.0 - 5.6 %   HbA1c POC (<> result, manual entry)     HbA1c, POC (prediabetic range)     HbA1c, POC (controlled diabetic range)     A1c has improved from last visit from 8.5-7.5. Increase Trulicity to 1.5 mg weekly Continue on statin.  She does have some concern if it is causing her more fatigue or bone pain.  Discussed she could cut back to every other day and see if that helps some.  I actually do not think her pain is coming from medication.  See below for work-up of right shoulder pain Blood pressure is a little low today.  This could be contributing some of her fatigue.  She is already taking lisinopril 10 mg.  Cut to 5 mg. Foot exam UTD.  Eye exam declined.  Pneumonia vaccine declined. covid vaccine declined.  Follow up in 3 months.   Right shoulder pain seems like more impingement syndrome.  Will start with x-ray.  Gave patient diclofenac cream to use regularly.  Continue stretches.  Discussed sports medicine referral in the future and/or to consider injection.

## 2019-11-12 ENCOUNTER — Other Ambulatory Visit: Payer: Self-pay | Admitting: Physician Assistant

## 2019-11-12 LAB — IRON,TIBC AND FERRITIN PANEL
Ferritin: 10 ng/mL — ABNORMAL LOW (ref 16–288)
Iron: 62 ug/dL (ref 45–160)

## 2019-11-12 LAB — CBC
HCT: 33.6 % — ABNORMAL LOW (ref 35.0–45.0)
Hemoglobin: 11 g/dL — ABNORMAL LOW (ref 11.7–15.5)
MCH: 28.9 pg (ref 27.0–33.0)
MCHC: 32.7 g/dL (ref 32.0–36.0)
MCV: 88.2 fL (ref 80.0–100.0)
MPV: 11.2 fL (ref 7.5–12.5)
Platelets: 256 10*3/uL (ref 140–400)
RBC: 3.81 10*6/uL (ref 3.80–5.10)
RDW: 12.8 % (ref 11.0–15.0)
WBC: 5 10*3/uL (ref 3.8–10.8)

## 2019-11-12 LAB — COMPLETE METABOLIC PANEL WITH GFR
AG Ratio: 1.9 (calc) (ref 1.0–2.5)
ALT: 15 U/L (ref 6–29)
AST: 18 U/L (ref 10–35)
Albumin: 4.1 g/dL (ref 3.6–5.1)
Alkaline phosphatase (APISO): 62 U/L (ref 37–153)
BUN: 15 mg/dL (ref 7–25)
CO2: 25 mmol/L (ref 20–32)
Calcium: 9.2 mg/dL (ref 8.6–10.4)
Chloride: 103 mmol/L (ref 98–110)
Creat: 0.75 mg/dL (ref 0.50–0.99)
GFR, Est African American: 96 mL/min/{1.73_m2} (ref >=60)
GFR, Est Non African American: 83 mL/min/{1.73_m2} (ref >=60)
Globulin: 2.2 g/dL (calc) (ref 1.9–3.7)
Glucose, Bld: 139 mg/dL — ABNORMAL HIGH (ref 65–99)
Potassium: 4.3 mmol/L (ref 3.5–5.3)
Sodium: 139 mmol/L (ref 135–146)
Total Bilirubin: 0.4 mg/dL (ref 0.2–1.2)
Total Protein: 6.3 g/dL (ref 6.1–8.1)

## 2019-11-12 LAB — LIPID PANEL W/REFLEX DIRECT LDL
Cholesterol: 136 mg/dL (ref <200)
HDL: 57 mg/dL (ref >=50)
LDL Cholesterol (Calc): 63 mg/dL (calc)
Non-HDL Cholesterol (Calc): 79 mg/dL (calc) (ref <130)
Total CHOL/HDL Ratio: 2.4 (calc) (ref <5.0)
Triglycerides: 77 mg/dL (ref <150)

## 2019-11-12 NOTE — Progress Notes (Signed)
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 I do think injection could likely help. Consider appt with Dr. Karie Schwalbe soon to help you with pain.

## 2019-11-12 NOTE — Progress Notes (Signed)
Pat,   Cholesterol looks SO much better!  Liver enymzes improved.  Your hemoglobin is a little low. Are you not eating as much iron rich foods?   Please add ferritin and iron to labs.

## 2019-11-13 ENCOUNTER — Encounter: Payer: Self-pay | Admitting: Physician Assistant

## 2019-11-13 ENCOUNTER — Other Ambulatory Visit: Payer: Self-pay | Admitting: Neurology

## 2019-11-13 DIAGNOSIS — D509 Iron deficiency anemia, unspecified: Secondary | ICD-10-CM | POA: Insufficient documentation

## 2019-11-13 DIAGNOSIS — E611 Iron deficiency: Secondary | ICD-10-CM

## 2019-11-13 NOTE — Progress Notes (Signed)
Pat.   Your total iron low normal and iron stores real low. Increase iron rich food and start iron supplement 60mg  of elemental iron every morning. Recheck in 3 months. Watch for any abdominal pain, bright red or tarry sticky stools. Let me know if any of that changes.

## 2019-11-26 ENCOUNTER — Other Ambulatory Visit: Payer: Self-pay

## 2019-11-26 ENCOUNTER — Other Ambulatory Visit (HOSPITAL_COMMUNITY)
Admission: RE | Admit: 2019-11-26 | Discharge: 2019-11-26 | Disposition: A | Discharge status: Home / Self Care | Payer: Medicare Other | Source: Ambulatory Visit | Attending: Obstetrics & Gynecology | Admitting: Obstetrics & Gynecology

## 2019-11-26 ENCOUNTER — Ambulatory Visit (INDEPENDENT_AMBULATORY_CARE_PROVIDER_SITE_OTHER): Payer: Medicare Other | Admitting: Obstetrics & Gynecology

## 2019-11-26 ENCOUNTER — Encounter: Payer: Self-pay | Admitting: Obstetrics & Gynecology

## 2019-11-26 VITALS — BP 131/71 | HR 101 | Resp 16 | Ht 65.0 in | Wt 199.0 lb

## 2019-11-26 DIAGNOSIS — Z1151 Encounter for screening for human papillomavirus (HPV): Secondary | ICD-10-CM | POA: Insufficient documentation

## 2019-11-26 DIAGNOSIS — Z01419 Encounter for gynecological examination (general) (routine) without abnormal findings: Secondary | ICD-10-CM

## 2019-11-26 DIAGNOSIS — Z124 Encounter for screening for malignant neoplasm of cervix: Secondary | ICD-10-CM

## 2019-11-26 NOTE — Patient Instructions (Signed)
Preventive Care 67 Years and Older, Female Preventive care refers to lifestyle choices and visits with your health care provider that can promote health and wellness. This includes:  A yearly physical exam. This is also called an annual well check.  Regular dental and eye exams.  Immunizations.  Screening for certain conditions.  Healthy lifestyle choices, such as diet and exercise. What can I expect for my preventive care visit? Physical exam Your health care provider will check:  Height and weight. These may be used to calculate body mass index (BMI), which is a measurement that tells if you are at a healthy weight.  Heart rate and blood pressure.  Your skin for abnormal spots. Counseling Your health care provider may ask you questions about:  Alcohol, tobacco, and drug use.  Emotional well-being.  Home and relationship well-being.  Sexual activity.  Eating habits.  History of falls.  Memory and ability to understand (cognition).  Work and work Statistician.  Pregnancy and menstrual history. What immunizations do I need?  Influenza (flu) vaccine  This is recommended every year. Tetanus, diphtheria, and pertussis (Tdap) vaccine  You may need a Td booster every 10 years. Varicella (chickenpox) vaccine  You may need this vaccine if you have not already been vaccinated. Zoster (shingles) vaccine  You may need this after age 67. Pneumococcal conjugate (PCV13) vaccine  One dose is recommended after age 67. Pneumococcal polysaccharide (PPSV23) vaccine  One dose is recommended after age 67. Measles, mumps, and rubella (MMR) vaccine  You may need at least one dose of MMR if you were born in 1957 or later. You may also need a second dose. Meningococcal conjugate (MenACWY) vaccine  You may need this if you have certain conditions. Hepatitis A vaccine  You may need this if you have certain conditions or if you travel or work in places where you may be exposed  to hepatitis A. Hepatitis B vaccine  You may need this if you have certain conditions or if you travel or work in places where you may be exposed to hepatitis B. Haemophilus influenzae type b (Hib) vaccine  You may need this if you have certain conditions. You may receive vaccines as individual doses or as more than one vaccine together in one shot (combination vaccines). Talk with your health care provider about the risks and benefits of combination vaccines. What tests do I need? Blood tests  Lipid and cholesterol levels. These may be checked every 5 years, or more frequently depending on your overall health.  Hepatitis C test.  Hepatitis B test. Screening  Lung cancer screening. You may have this screening every year starting at age 67 if you have a 30-pack-year history of smoking and currently smoke or have quit within the past 15 years.  Colorectal cancer screening. All adults should have this screening starting at age 67 and continuing until age 15. Your health care provider may recommend screening at age 23 if you are at increased risk. You will have tests every 1-10 years, depending on your results and the type of screening test.  Diabetes screening. This is done by checking your blood sugar (glucose) after you have not eaten for a while (fasting). You may have this done every 1-3 years.  Mammogram. This may be done every 1-2 years. Talk with your health care provider about how often you should have regular mammograms.  BRCA-related cancer screening. This may be done if you have a family history of breast, ovarian, tubal, or peritoneal cancers.  Other tests  Sexually transmitted disease (STD) testing.  Bone density scan. This is done to screen for osteoporosis. You may have this done starting at age 67. Follow these instructions at home: Eating and drinking  Eat a diet that includes fresh fruits and vegetables, whole grains, lean protein, and low-fat dairy products. Limit  your intake of foods with high amounts of sugar, saturated fats, and salt.  Take vitamin and mineral supplements as recommended by your health care provider.  Do not drink alcohol if your health care provider tells you not to drink.  If you drink alcohol: ? Limit how much you have to 0-1 drink a day. ? Be aware of how much alcohol is in your drink. In the [place] one drink equals one 12 oz bottle of beer (355 mL), one 5 oz glass of wine (148 mL), or one 1 oz glass of hard liquor (44 mL). Lifestyle  Take daily care of your teeth and gums.  Stay active. Exercise for at least 30 minutes on 5 or more days each week.  Do not use any products that contain nicotine or tobacco, such as cigarettes, e-cigarettes, and chewing tobacco. If you need help quitting, ask your health care provider.  If you are sexually active, practice safe sex. Use a condom or other form of protection in order to prevent STIs (sexually transmitted infections).  Talk with your health care provider about taking a low-dose aspirin or statin. What's next?  Go to your health care provider once a year for a well check visit.  Ask your health care provider how often you should have your eyes and teeth checked.  Stay up to date on all vaccines. This information is not intended to replace advice given to you by your health care provider. Make sure you discuss any questions you have with your health care provider. Document Revised: 04/03/2018 Document Reviewed: 04/03/2018 Elsevier Patient Education  2020 [employer].

## 2019-11-26 NOTE — Progress Notes (Signed)
GYNECOLOGY ANNUAL PREVENTATIVE CARE ENCOUNTER NOTE  History:     Connie Cardenas is a 69 y.o. [id number] female here for a routine annual gynecologic exam.  Current complaints: occasional episodes of incontinence, feels like she really has to go and leaks urine if she does not get to bathroom in time.  Told PCP who advised Kegel's, this helped but this still happens sporadically.   Denies abnormal vaginal bleeding, discharge, pelvic pain, problems with intercourse or other gynecologic concerns.    Gynecologic History No LMP recorded. Patient is postmenopausal. Last Pap: Over six years ago. Results were: normal with negative HPV. No history of abnormal paps.  Last mammogram: 08/10/2019. Results were: normal  Obstetric History OB History  Gravida Para Term Preterm AB Living  5 5 5         SAB TAB Ectopic Multiple Live Births          5    # Outcome Date GA Lbr Len/2nd Weight Sex Delivery Anes PTL Lv  5 Term           4 Term           3 Term           2 Term           1 Term             Past Medical History:  Diagnosis Date  . Diabetes mellitus without complication (HCC)   . Hypertension     Past Surgical History:  Procedure Laterality Date  . TUBAL LIGATION      Current Outpatient Medications on File Prior to Visit  Medication Sig Dispense Refill  . AMBULATORY NON FORMULARY MEDICATION Glucometer lancets and test strips for DM to check 1-2 times a day. 100 each 0  . Ascorbic Acid (VITA-C PO) Take by mouth daily.    . Ascorbic Acid (VITAMIN C PO) Take by mouth.    aspirin 81 MG tablet Take 81 mg by mouth daily.    . Cholecalciferol (VITAMIN D PO) Take by mouth.    . Dulaglutide (TRULICITY) 1.5 MG/0.5ML SOPN Inject 0.5 mLs (1.5 mg total) into the skin once a week. 4 pen 2  . KRILL OIL PO Take by mouth daily.    Marland Kitchen lisinopril (ZESTRIL) 5 MG tablet Take 1 tablet (5 mg total) by mouth daily. 90 tablet 3  . metFORMIN (GLUCOPHAGE) 1000 MG tablet Take 1 tablet (1,000 mg  total) by mouth 2 (two) times daily. 180 tablet 1  . pioglitazone (ACTOS) 45 MG tablet Take 1 tablet (45 mg total) by mouth daily. 90 tablet 1  . rosuvastatin (CRESTOR) 10 MG tablet Take 1 tablet (10 mg total) by mouth daily. 90 tablet 3  . diclofenac Sodium (VOLTAREN) 1 % GEL Apply 4 g topically 4 (four) times daily. To affected joint. (Patient not taking: Reported on 11/26/2019) 100 g 11  . Multiple Vitamins-Minerals (ZINC PO) Take by mouth daily. (Patient not taking: Reported on 11/26/2019)     No current facility-administered medications on file prior to visit.    Allergies  Allergen Reactions  . 01/26/2020 [Dapagliflozin]     Recurrent yeast infections.   . Penicillins     Social History:  reports that she has never smoked. She has never used smokeless tobacco. She reports that she does not drink alcohol and does not use drugs.  Family History  Problem Relation Age of Onset  . Cancer Sister  non-hodkins lymphoma  . Cancer Mother        colon  . Hypertension Mother   . Heart attack Father   . Hyperlipidemia Father   . Stroke Paternal Grandmother     The following portions of the patient's history were reviewed and updated as appropriate: allergies, current medications, past family history, past medical history, past social history, past surgical history and problem list.  Review of Systems Pertinent items noted in HPI and remainder of comprehensive ROS otherwise negative.  Physical Exam:  BP 131/71   Pulse (!) 101   Resp 16   Ht 5\' 5"  (1.651 m)   Wt 199 lb (90.3 kg)   BMI 33.12 kg/m  CONSTITUTIONAL: Well-developed, well-nourished female in no acute distress.  HENT:  Normocephalic, atraumatic, External right and left ear normal. Oropharynx is clear and moist EYES: Conjunctivae and EOM are normal. Pupils are equal, round, and reactive to light. No scleral icterus.  NECK: Normal range of motion, supple, no masses.  Normal thyroid.  SKIN: Skin is warm and dry. No rash  noted. Not diaphoretic. No erythema. No pallor. MUSCULOSKELETAL: Normal range of motion. No tenderness.  No cyanosis, clubbing, or edema.  2+ distal pulses. NEUROLOGIC: Alert and oriented to person, place, and time. Normal reflexes, muscle tone coordination.  PSYCHIATRIC: Normal mood and affect. Normal behavior. Normal judgment and thought content. CARDIOVASCULAR: Normal heart rate noted, regular rhythm RESPIRATORY: Clear to auscultation bilaterally. Effort and breath sounds normal, no problems with respiration noted. BREASTS: Symmetric in size. No masses, tenderness, skin changes, nipple drainage, or lymphadenopathy bilaterally. Performed in the presence of a chaperone. ABDOMEN: Soft, no distention noted.  No tenderness, rebound or guarding.  PELVIC: Normal appearing external genitalia and urethral meatus; normal appearing vaginal mucosa and cervix with moderate atrophy.  No abnormal discharge noted.  Pap smear obtained.  Normal uterine size, no other palpable masses, no uterine or adnexal tenderness.  Performed in the presence of a chaperone.   Assessment and Plan:    1. Well woman exam with routine gynecological exam Will follow up results of pap smear and manage accordingly. If normal, no further pap smear screening needed. Mammogram is up to date. Offered Urology referral for incontinence evaluation, she will consider this and let know. Routine preventative health maintenance measures emphasized. Please refer to After Visit Summary for other counseling recommendations.      [place], MD, FACOG Obstetrician & Gynecologist, [facility] for [facility]

## 2019-11-30 LAB — CYTOLOGY - PAP
Comment: NEGATIVE
Diagnosis: NEGATIVE
High risk HPV: NEGATIVE

## 2020-02-10 ENCOUNTER — Encounter: Payer: Self-pay | Admitting: Physician Assistant

## 2020-02-10 ENCOUNTER — Ambulatory Visit (INDEPENDENT_AMBULATORY_CARE_PROVIDER_SITE_OTHER): Payer: Medicare Other | Admitting: Physician Assistant

## 2020-02-10 VITALS — BP 128/72 | HR 91 | Ht 65.0 in | Wt 193.0 lb

## 2020-02-10 DIAGNOSIS — Z1159 Encounter for screening for other viral diseases: Secondary | ICD-10-CM

## 2020-02-10 DIAGNOSIS — E1169 Type 2 diabetes mellitus with other specified complication: Secondary | ICD-10-CM | POA: Diagnosis not present

## 2020-02-10 DIAGNOSIS — I6521 Occlusion and stenosis of right carotid artery: Secondary | ICD-10-CM | POA: Diagnosis not present

## 2020-02-10 DIAGNOSIS — D508 Other iron deficiency anemias: Secondary | ICD-10-CM | POA: Diagnosis not present

## 2020-02-10 DIAGNOSIS — I350 Nonrheumatic aortic (valve) stenosis: Secondary | ICD-10-CM

## 2020-02-10 DIAGNOSIS — I10 Essential (primary) hypertension: Secondary | ICD-10-CM

## 2020-02-10 DIAGNOSIS — Z8616 Personal history of COVID-19: Secondary | ICD-10-CM | POA: Diagnosis not present

## 2020-02-10 DIAGNOSIS — Z7185 Encounter for immunization safety counseling: Secondary | ICD-10-CM

## 2020-02-10 LAB — POCT GLYCOSYLATED HEMOGLOBIN (HGB A1C): Hemoglobin A1C: 7.5 % — AB (ref 4.0–5.6)

## 2020-02-10 LAB — POCT UA - MICROALBUMIN
Albumin/Creatinine Ratio, Urine, POC: <30
Creatinine, POC: 300 mg/dL
Microalbumin Ur, POC: 30 mg/L

## 2020-02-10 MED ORDER — TRULICITY 1.5 MG/0.5ML ~~LOC~~ SOAJ: 1.5000 mg | SUBCUTANEOUS | 2 refills | Status: DC

## 2020-02-10 NOTE — Progress Notes (Signed)
Subjective:    Patient ID: Connie Cardenas, female    DOB: [birth year], 69 y.o.   MRN: [id number]  HPI  Pt is a 69 yo female with T2DM, HLD, HTN, IDA who presents to the clinic for 3 month follow up.   She would like to get labs to follow up on iron. She is taking iron OTC. She does feel better.   She is not taking trulicity due to cost. Not checking sugars. No hypoglycemic events. No open sores or wounds.   No CP, palpitations, headaches, or vision changes.   She is worried about vaccine and carotid artery stenosis. She is worried she will have a stroke after. She has antibodies but requiring covid vaccine to travel.  .. .. Active Ambulatory Problems    Diagnosis Date Noted  . Dyslipidemia, goal LDL below 70 10/04/2015  . Type 2 diabetes mellitus without complication, without long-term current use of insulin (HCC) 10/04/2015  . Essential hypertension, benign 10/04/2015  . Obesity 01/04/2016  . Hyperlipidemia associated with type 2 diabetes mellitus (HCC) 04/14/2016  . Elevated liver enzymes 07/15/2017  . Systolic ejection murmur 07/15/2017  . Left carotid bruit 07/15/2017  . Liver cyst 04/08/2018  . Breast cyst, left 04/11/2018  . Moderate aortic valve stenosis 04/11/2018  . Bicuspid aortic valve 04/11/2018  . Carotid artery stenosis, asymptomatic, right 04/11/2018  . Swelling of left index finger 06/11/2018  . History of COVID-19 05/05/2019  . Chronic right shoulder pain 08/03/2019  . IDA (iron deficiency anemia) 11/13/2019   Resolved Ambulatory Problems    Diagnosis Date Noted  . COVID-19 04/22/2019   Past Medical History:  Diagnosis Date  . Diabetes mellitus without complication (HCC)   . Hypertension     Review of Systems  All other systems reviewed and are negative.      Objective:   Physical Exam Vitals reviewed.  Constitutional:      Appearance: Normal appearance. She is obese.  Neck:     Vascular: No carotid bruit.  Cardiovascular:      Rate and Rhythm: Normal rate and regular rhythm.     Heart sounds: Murmur heard.   Pulmonary:     Effort: Pulmonary effort is normal.  Lymphadenopathy:     Cervical: No cervical adenopathy.  Neurological:     General: No focal deficit present.     Mental Status: [name], place, and time.  Psychiatric:        Mood and Affect: Mood normal.           Assessment & Plan:  Marland KitchenMarland KitchenLesta was seen today for diabetes.  Diagnoses and all orders for this visit:  Type 2 diabetes mellitus with other specified complication, without long-term current use of insulin (HCC) -     POCT glycosylated hemoglobin (Hb A1C) -     POCT UA - Microalbumin -     Dulaglutide (TRULICITY) 1.5 MG/0.5ML SOPN; Inject 1.5 mg into the skin once a week.  Iron deficiency anemia secondary to inadequate dietary iron intake -     Fe+TIBC+Fer -     CBC  Encounter for hepatitis C screening test for low risk patient -     Hepatitis C Antibody  History of COVID-19 -     SARS-CoV-2 Semi-Quantitative Total Antibody, Spike  Vaccine counseling  Carotid artery stenosis, asymptomatic, right  Essential hypertension, benign  Moderate aortic valve stenosis   .Marland Kitchen Lab Results  Component Value Date   HGBA1C  7.5 (A) 02/10/2020   A1C not to goal.  Pt has not been on trulicity due to cost.  It is showing preferred on insurance work with pharmacy to determine if any other GLP are covered better.  Continue metformin/actos. On statin.  BP to goal on second recheck. On ACE. No protein in urine.  Pt declines flu/covid/pneumonia vaccine.  Follow up in 3 months.   Discussed ok to schedule with early to make sure carotid stenosis is stable before vaccine. Stay on crestor and ASA daily. Reassured about covid vaccine. Yes there are risk but they are still small. Pt wants to know antibody status Ordered today.   Last labs showed anemia. Pt has been on OTC iron. She feels much better. Recheck  CBC/Ferritin today.

## 2020-02-12 NOTE — Progress Notes (Signed)
Pat,   Hemoglobin is up some but just in normal range. Iron stores have not changed. Take iron with vitamin C it helps with absorption. Recheck in 3 months.

## 2020-02-14 LAB — CBC
HCT: 35.7 % (ref 35.0–45.0)
Hemoglobin: 11.7 g/dL (ref 11.7–15.5)
MCH: 28.9 pg (ref 27.0–33.0)
MCHC: 32.8 g/dL (ref 32.0–36.0)
MCV: 88.1 fL (ref 80.0–100.0)
MPV: 11.1 fL (ref 7.5–12.5)
Platelets: 278 10*3/uL (ref 140–400)
RBC: 4.05 10*6/uL (ref 3.80–5.10)
RDW: 13.1 % (ref 11.0–15.0)
WBC: 4.6 10*3/uL (ref 3.8–10.8)

## 2020-02-14 LAB — IRON,TIBC AND FERRITIN PANEL
%SAT: 13 % (calc) — ABNORMAL LOW (ref 16–45)
Ferritin: 9 ng/mL — ABNORMAL LOW (ref 16–288)
Iron: 56 ug/dL (ref 45–160)
TIBC: 431 mcg/dL (calc) (ref 250–450)

## 2020-02-14 LAB — HEPATITIS C ANTIBODY
Hepatitis C Ab: NONREACTIVE
SIGNAL TO CUT-OFF: 0 (ref <1.00)

## 2020-02-14 LAB — SARS-COV-2 SEMI-QUANTITATIVE TOTAL ANTIBODY, SPIKE: SARS COV2 AB, Total Spike Semi QN: 227 U/mL — ABNORMAL HIGH (ref <0.8)

## 2020-02-15 NOTE — Progress Notes (Signed)
You do have covid antibodies.

## 2020-02-17 ENCOUNTER — Encounter: Payer: Self-pay | Admitting: Physician Assistant

## 2020-02-26 ENCOUNTER — Other Ambulatory Visit: Payer: Self-pay | Admitting: *Deleted

## 2020-02-26 DIAGNOSIS — I6521 Occlusion and stenosis of right carotid artery: Secondary | ICD-10-CM

## 2020-03-07 ENCOUNTER — Ambulatory Visit (HOSPITAL_COMMUNITY)
Admission: RE | Admit: 2020-03-07 | Discharge: 2020-03-07 | Disposition: A | Discharge status: Home / Self Care | Payer: Medicare Other | Source: Ambulatory Visit | Attending: Surgery | Admitting: Surgery

## 2020-03-07 ENCOUNTER — Ambulatory Visit (INDEPENDENT_AMBULATORY_CARE_PROVIDER_SITE_OTHER): Payer: Medicare Other | Admitting: Physician Assistant

## 2020-03-07 ENCOUNTER — Other Ambulatory Visit: Payer: Self-pay

## 2020-03-07 VITALS — BP 114/74 | HR 88 | Temp 97.2°F | Resp 20 | Ht 65.0 in | Wt 191.5 lb

## 2020-03-07 DIAGNOSIS — I6521 Occlusion and stenosis of right carotid artery: Secondary | ICD-10-CM

## 2020-03-07 NOTE — Progress Notes (Signed)
Office Note     CC:  follow up Requesting Provider:  Jomarie Longs, PA-C  HPI: Connie Cardenas is a 69 y.o. ([birth year]) female who presents for routine follow-up bilateral carotid artery stenosis.  She was initially evaluated by Dr. Arbie Cookey on June 10, 2018.  She was advised to return in 6 months for repeat duplex.  Unfortunately, she developed COVID-19 infection and was unable to follow-up at that time.  She reports anemia and fatigue post Covid infection.  She also has had some issues with her right shoulder and right upper arm.  She feels as though her range of motion is limited.  She denies amaurosis fugax, slurred speech, facial drooping, monocular blindness, other extremity weakness or paralysis.  Carotid duplex at [facility] suggested 60 to 70% right internal carotid artery stenosis and no significant left carotid stenosis. She underwent repeat right carotid duplex in our office at her initial consultation with Dr. Arbie Cookey.. This showed a much different finding with no significant stenosis in the right internal carotid artery or common carotid artery.  She is a diabetic.  She has a history of hypertension and hyperlipidemia. She is compliant with her statin daily.  She also is on a daily aspirin Past Medical History:  Diagnosis Date  . Diabetes mellitus without complication (HCC)   . Hypertension     Past Surgical History:  Procedure Laterality Date  . TUBAL LIGATION      Social History   Socioeconomic History  . Marital status: Married    Spouse name: Not on file  . Number of children: Not on file  . Years of education: Not on file  . Highest education level: Not on file  Occupational History  . Not on file  Tobacco Use  . Smoking status: Never Smoker  . Smokeless tobacco: Never Used  Vaping Use  . Vaping Use: Never used  Substance and Sexual Activity  . Alcohol use: No    Alcohol/week: 0.0 standard drinks  . Drug use: No  . Sexual activity:  Not Currently    Birth control/protection: Surgical  Other Topics Concern  . Not on file  Social History Narrative  . Not on file   Social Determinants of Health   Financial Resource Strain:   . Difficulty of Paying Living Expenses: Not on file  Food Insecurity:   . Worried About Programme researcher, broadcasting/film/video in the Last Year: Not on file  . Ran Out of Food in the Last Year: Not on file  Transportation Needs:   . Lack of Transportation (Medical): Not on file  . Lack of Transportation (Non-Medical): Not on file  Physical Activity:   . Days of Exercise per Week: Not on file  . Minutes of Exercise per Session: Not on file  Stress:   . Feeling of Stress : Not on file  Social Connections:   . Frequency of Communication with Friends and Family: Not on file  . Frequency of Social Gatherings with Friends and Family: Not on file  . Attends Religious Services: Not on file  . Active Member of Clubs or Organizations: Not on file  . Attends Banker Meetings: Not on file  . Marital Status: Not on file  Intimate Partner Violence:   . Fear of Current or Ex-Partner: Not on file  . Emotionally Abused: Not on file  . Physically Abused: Not on file  . Sexually Abused: Not on file   Family History  Problem Relation Age  of Onset  . Cancer Sister        non-hodkins lymphoma  . Cancer Mother        colon  . Hypertension Mother   . Heart attack Father   . Hyperlipidemia Father   . Stroke Paternal Grandmother     Current Outpatient Medications  Medication Sig Dispense Refill  . AMBULATORY NON FORMULARY MEDICATION Glucometer lancets and test strips for DM to check 1-2 times a day. 100 each 0  . Ascorbic Acid (VITA-C PO) Take by mouth daily.    . Ascorbic Acid (VITAMIN C PO) Take by mouth.    Marland Kitchen aspirin 81 MG tablet Take 81 mg by mouth daily.    . Cholecalciferol (VITAMIN D PO) Take by mouth.    . diclofenac Sodium (VOLTAREN) 1 % GEL Apply 4 g topically 4 (four) times daily. To affected  joint. 100 g 11  . Dulaglutide (TRULICITY) 1.5 MG/0.5ML SOPN Inject 1.5 mg into the skin once a week. 2 mL 2  . Ferrous Sulfate (IRON PO) Take by mouth in the morning, at noon, and at bedtime.    Marland Kitchen KRILL OIL PO Take by mouth daily.    Marland Kitchen lisinopril (ZESTRIL) 5 MG tablet Take 1 tablet (5 mg total) by mouth daily. 90 tablet 3  . metFORMIN (GLUCOPHAGE) 1000 MG tablet Take 1 tablet (1,000 mg total) by mouth 2 (two) times daily. 180 tablet 1  . Multiple Vitamins-Minerals (ZINC PO) Take by mouth daily.     . pioglitazone (ACTOS) 45 MG tablet Take 1 tablet (45 mg total) by mouth daily. 90 tablet 1  . rosuvastatin (CRESTOR) 10 MG tablet Take 1 tablet (10 mg total) by mouth daily. 90 tablet 3   No current facility-administered medications for this visit.    Allergies  Allergen Reactions  . Marcelline Deist [Dapagliflozin]     Recurrent yeast infections.   . Penicillins      REVIEW OF SYSTEMS:   [X]  denotes positive finding, [ ]  denotes negative finding Cardiac  Comments:  Chest pain or chest pressure:    Shortness of breath upon exertion:    Short of breath when lying flat:    Irregular heart rhythm:        Vascular    Pain in calf, thigh, or hip brought on by ambulation:    Pain in feet at night that wakes you up from your sleep:     Blood clot in your veins:    Leg swelling:  x       Pulmonary    Oxygen at home:    Productive cough:     Wheezing:         Neurologic    Sudden weakness in arms or legs:     Sudden numbness in arms or legs:     Sudden onset of difficulty speaking or slurred speech:    Temporary loss of vision in one eye:     Problems with dizziness:         Gastrointestinal    Blood in stool:     Vomited blood:         Genitourinary    Burning when urinating:     Blood in urine:        Psychiatric    Major depression:         Hematologic    Bleeding problems:    Problems with blood clotting too easily:        Skin    Rashes  or ulcers:         Constitutional    Fever or chills:      PHYSICAL EXAMINATION:  Vitals:   03/07/20 1330 03/07/20 1333  BP: 103/71 114/74  Pulse: 88   Resp: 20   Temp: (!) 97.2 F (36.2 C)   SpO2: 99%     General:  WDWN in NAD; vital signs documented above Gait: Unaided, no ataxia HENT: WNL, normocephalic Pulmonary: normal non-labored breathing , without Rales, rhonchi,  wheezing Cardiac: regular HR, with systolic Murmur with carotid bruits Skin: without rashes Vascular Exam/Pulses: 2+ brachial and radial pulses bilaterally Extremities: without ischemic changes, without Gangrene , without cellulitis; without open wounds; 5/5 hand grip strength, triceps and biceps strength bilaterally. Musculoskeletal: no muscle wasting or atrophy.  Soft tissue contour change noted of the right medial calf.  This is nontender and nonpulsatile.  No skin changes.  Neurologic: A&O X 3;  No focal weakness or paresthesias are detected Psychiatric:  The pt has Normal affect.   Non-Invasive Vascular Imaging:   03/07/2020 Right Carotid: Velocities in the right ICA are consistent with a 1-39%  stenosis.   Left Carotid: Velocities in the left ICA are consistent with a 1-39%  stenosis.   Vertebrals: Bilateral vertebral arteries demonstrate antegrade flow  Normal flow dynamics bilateral subclavian arteries.  ASSESSMENT/PLAN:: 69 y.o. female here for follow up for mild bilateral carotid artery stenosis.  Stable as compared to February 2020.  No evidence of subclavian stenosis.  The patient would like to continue more frequent surveillance.  We reviewed the signs and symptoms of stroke/TIA and instructed her to seek immediate medical attention should these occur.  Continue statin and aspirin.  We will follow-up in 1 year with repeat duplex.  Milinda Antis, PA-C [facility] [phone]  Clinic MD:   Myra Gianotti

## 2020-04-19 ENCOUNTER — Telehealth: Payer: Self-pay

## 2020-04-19 NOTE — Telephone Encounter (Signed)
Because of her travel to [place] I recommend that she give PCV 13 and then at least 1 year later can get then PCV 23.

## 2020-04-19 NOTE — Telephone Encounter (Signed)
Connie Cardenas called wanting to schedule for a nurse visit to get the pneumonia vaccine. Which pneumonia vaccine should she receive?

## 2020-04-19 NOTE — Telephone Encounter (Signed)
Please call and schedule patient for a nurse visit for PCV 13, pneumonia vaccine.

## 2020-04-25 NOTE — Telephone Encounter (Signed)
Spoke to pt. She will call back to reschedule.

## 2020-05-04 ENCOUNTER — Ambulatory Visit: Payer: Medicare Other | Admitting: Physician Assistant

## 2020-05-10 ENCOUNTER — Other Ambulatory Visit: Payer: Self-pay | Admitting: Physician Assistant

## 2020-05-10 DIAGNOSIS — E119 Type 2 diabetes mellitus without complications: Secondary | ICD-10-CM

## 2020-05-11 ENCOUNTER — Other Ambulatory Visit: Payer: Self-pay | Admitting: Physician Assistant

## 2020-05-11 DIAGNOSIS — E119 Type 2 diabetes mellitus without complications: Secondary | ICD-10-CM

## 2020-06-01 ENCOUNTER — Encounter: Payer: Self-pay | Admitting: Physician Assistant

## 2020-06-01 ENCOUNTER — Other Ambulatory Visit: Payer: Self-pay

## 2020-06-01 ENCOUNTER — Ambulatory Visit (INDEPENDENT_AMBULATORY_CARE_PROVIDER_SITE_OTHER): Payer: Medicare Other | Admitting: Physician Assistant

## 2020-06-01 VITALS — BP 128/78 | HR 82 | Ht 65.0 in | Wt 196.0 lb

## 2020-06-01 DIAGNOSIS — E118 Type 2 diabetes mellitus with unspecified complications: Secondary | ICD-10-CM

## 2020-06-01 DIAGNOSIS — IMO0002 Reserved for concepts with insufficient information to code with codable children: Secondary | ICD-10-CM | POA: Insufficient documentation

## 2020-06-01 DIAGNOSIS — E1165 Type 2 diabetes mellitus with hyperglycemia: Secondary | ICD-10-CM | POA: Diagnosis not present

## 2020-06-01 DIAGNOSIS — E1169 Type 2 diabetes mellitus with other specified complication: Secondary | ICD-10-CM | POA: Diagnosis not present

## 2020-06-01 DIAGNOSIS — D508 Other iron deficiency anemias: Secondary | ICD-10-CM

## 2020-06-01 LAB — POCT GLYCOSYLATED HEMOGLOBIN (HGB A1C): Hemoglobin A1C: 8.5 % — AB (ref 4.0–5.6)

## 2020-06-01 MED ORDER — SITAGLIPTIN PHOSPHATE 100 MG PO TABS: 100.0000 mg | ORAL_TABLET | Freq: Every day | ORAL | 1 refills | Status: DC

## 2020-06-01 MED ORDER — PIOGLITAZONE HCL 45 MG PO TABS: 45.0000 mg | ORAL_TABLET | Freq: Every day | ORAL | 0 refills | Status: DC

## 2020-06-01 MED ORDER — METFORMIN HCL 1000 MG PO TABS: 1000.0000 mg | ORAL_TABLET | Freq: Two times a day (BID) | ORAL | 0 refills | Status: DC

## 2020-06-01 NOTE — Progress Notes (Signed)
Subjective:    Patient ID: Connie Cardenas, female    DOB: [birth year], 70 y.o.   MRN: [id number]  HPI  Patient is a 70 year old female with type 2 diabetes, hypertension, aortic valve stenosis, hyperlipidemia who presents to the clinic for 63-month follow-up.  Patient is on Metformin and Actos.  She was unable to afford Trulicity.  This is a deductible issue.  All branded drugs would likely be the same.  She is not checking her sugars regularly.  She denies any open sores or wounds.  She denies any hypoglycemic events.  She denies any chest pain, palpitations, headaches, vision changes.  .. Active Ambulatory Problems    Diagnosis Date Noted  . Dyslipidemia, goal LDL below 70 10/04/2015  . Diabetes mellitus (HCC) 10/04/2015  . Essential hypertension, benign 10/04/2015  . Obesity 01/04/2016  . Hyperlipidemia associated with type 2 diabetes mellitus (HCC) 04/14/2016  . Elevated liver enzymes 07/15/2017  . Systolic ejection murmur 07/15/2017  . Left carotid bruit 07/15/2017  . Liver cyst 04/08/2018  . Breast cyst, left 04/11/2018  . Moderate aortic valve stenosis 04/11/2018  . Bicuspid aortic valve 04/11/2018  . Carotid artery stenosis, asymptomatic, right 04/11/2018  . Swelling of left index finger 06/11/2018  . History of COVID-19 05/05/2019  . Chronic right shoulder pain 08/03/2019  . IDA (iron deficiency anemia) 11/13/2019  . Type II diabetes mellitus with complication, uncontrolled (HCC) 06/01/2020   Resolved Ambulatory Problems    Diagnosis Date Noted  . COVID-19 04/22/2019   Past Medical History:  Diagnosis Date  . Diabetes mellitus without complication (HCC)   . Hypertension        Review of Systems  All other systems reviewed and are negative.      Objective:   Physical Exam Vitals reviewed.  Constitutional:      Appearance: Normal appearance. She is obese.  Neck:     Vascular: Carotid bruit present.     Comments: Left carotid bruit.   Cardiovascular:     Rate and Rhythm: Normal rate and regular rhythm.     Pulses: Normal pulses.     Heart sounds: Murmur heard.    Pulmonary:     Effort: Pulmonary effort is normal.     Breath sounds: Normal breath sounds.  Musculoskeletal:     Right lower leg: No edema.     Left lower leg: No edema.  Neurological:     General: No focal deficit present.     Mental Status: [name], place, and time.  Psychiatric:        Mood and Affect: Mood normal.    .. Depression screen [facility] 2/9 11/11/2019 09/10/2018 06/11/2018 03/19/2018 07/15/2017  Decreased Interest 0 0 0 0 0  Down, Depressed, Hopeless 0 0 0 0 0  PHQ - 2 Score 0 0 0 0 0  Altered sleeping 1 0 - - -  Tired, decreased energy 1 0 - - -  Change in appetite 0 1 - - -  Feeling bad or failure about yourself  0 0 - - -  Trouble concentrating 0 0 - - -  Moving slowly or fidgety/restless 0 0 - - -  Suicidal thoughts 0 0 - - -  PHQ-9 Score 2 1 - - -  Difficult doing work/chores Not difficult at all Not difficult at all - - -   .Marland Kitchen Results for orders placed or performed in visit on 06/01/20  POCT glycosylated hemoglobin (Hb A1C)  Result  Value Ref Range   Hemoglobin A1C 8.5 (A) 4.0 - 5.6 %   HbA1c POC (<> result, manual entry)     HbA1c, POC (prediabetic range)     HbA1c, POC (controlled diabetic range)            Assessment & Plan:  Marland KitchenMarland KitchenSha was seen today for diabetes.  Diagnoses and all orders for this visit:  Type 2 diabetes mellitus with other specified complication, without long-term current use of insulin (HCC) -     POCT glycosylated hemoglobin (Hb A1C) -     sitaGLIPtin (JANUVIA) 100 MG tablet; Take 1 tablet (100 mg total) by mouth daily. -     pioglitazone (ACTOS) 45 MG tablet; Take 1 tablet (45 mg total) by mouth daily. -     metFORMIN (GLUCOPHAGE) 1000 MG tablet; Take 1 tablet (1,000 mg total) by mouth 2 (two) times daily with a meal. appt for refills -     COMPLETE METABOLIC PANEL  WITH GFR  Iron deficiency anemia secondary to inadequate dietary iron intake -     Fe+TIBC+Fer  Type II diabetes mellitus with complication, uncontrolled (HCC) -     POCT glycosylated hemoglobin (Hb A1C) -     sitaGLIPtin (JANUVIA) 100 MG tablet; Take 1 tablet (100 mg total) by mouth daily. -     pioglitazone (ACTOS) 45 MG tablet; Take 1 tablet (45 mg total) by mouth daily. -     metFORMIN (GLUCOPHAGE) 1000 MG tablet; Take 1 tablet (1,000 mg total) by mouth 2 (two) times daily with a meal. appt for refills -     COMPLETE METABOLIC PANEL WITH GFR   A1c has worsened. Looks like a GLP-1 is out of the question with the cost. Added DPP 4, Januvia. Continue Metformin and Actos. Patient did not tolerate GLT 2 because of frequent urinary tract infections and yeast. Discussed diabetic diet and regular exercise. Patient is on ACE and blood pressure to goal on second recheck On statin Up-to-date foot exam Up-to-date eye exam Patient declined flu and pneumonia vaccine. She plans to fly internationally in the next few months and will get Covid vaccine.  She just had Covid again.  She is waiting her 30 days.

## 2020-06-01 NOTE — Patient Instructions (Signed)
Diabetes Mellitus and Nutrition, Adult When you have diabetes, or diabetes mellitus, it is very important to have healthy eating habits because your blood sugar (glucose) levels are greatly affected by what you eat and drink. Eating healthy foods in the right amounts, at about the same times every day, can help you:  Control your blood glucose.  Lower your risk of heart disease.  Improve your blood pressure.  Reach or maintain a healthy weight. What can affect my meal plan? Every person with diabetes is different, and each person has different needs for a meal plan. Your health care provider may recommend that you work with a dietitian to make a meal plan that is best for you. Your meal plan may vary depending on factors such as:  The calories you need.  The medicines you take.  Your weight.  Your blood glucose, blood pressure, and cholesterol levels.  Your activity level.  Other health conditions you have, such as heart or kidney disease. How do carbohydrates affect me? Carbohydrates, also called carbs, affect your blood glucose level more than any other type of food. Eating carbs naturally raises the amount of glucose in your blood. Carb counting is a method for keeping track of how many carbs you eat. Counting carbs is important to keep your blood glucose at a healthy level, especially if you use insulin or take certain oral diabetes medicines. It is important to know how many carbs you can safely have in each meal. This is different for every person. Your dietitian can help you calculate how many carbs you should have at each meal and for each snack. How does alcohol affect me? Alcohol can cause a sudden decrease in blood glucose (hypoglycemia), especially if you use insulin or take certain oral diabetes medicines. Hypoglycemia can be a life-threatening condition. Symptoms of hypoglycemia, such as sleepiness, dizziness, and confusion, are similar to symptoms of having too much  alcohol.  Do not drink alcohol if: ? Your health care provider tells you not to drink. ? You are pregnant, may be pregnant, or are planning to become pregnant.  If you drink alcohol: ? Do not drink on an empty stomach. ? Limit how much you use to:  0-1 drink a day for women.  0-2 drinks a day for men. ? Be aware of how much alcohol is in your drink. In the [place] one drink equals one 12 oz bottle of beer (355 mL), one 5 oz glass of wine (148 mL), or one 1 oz glass of hard liquor (44 mL). ? Keep yourself hydrated with water, diet soda, or unsweetened iced tea.  Keep in mind that regular soda, juice, and other mixers may contain a lot of sugar and must be counted as carbs. What are tips for following this plan? Reading food labels  Start by checking the serving size on the "Nutrition Facts" label of packaged foods and drinks. The amount of calories, carbs, fats, and other nutrients listed on the label is based on one serving of the item. Many items contain more than one serving per package.  Check the total grams (g) of carbs in one serving. You can calculate the number of servings of carbs in one serving by dividing the total carbs by 15. For example, if a food has 30 g of total carbs per serving, it would be equal to 2 servings of carbs.  Check the number of grams (g) of saturated fats and trans fats in one serving. Choose foods that have   a low amount or none of these fats.  Check the number of milligrams (mg) of salt (sodium) in one serving. Most people should limit total sodium intake to less than 2,300 mg per day.  Always check the nutrition information of foods labeled as "low-fat" or "nonfat." These foods may be higher in added sugar or refined carbs and should be avoided.  Talk to your dietitian to identify your daily goals for nutrients listed on the label. Shopping  Avoid buying canned, pre-made, or processed foods. These foods tend to be high in fat, sodium, and added  sugar.  Shop around the outside edge of the grocery store. This is where you will most often find fresh fruits and vegetables, bulk grains, fresh meats, and fresh dairy. Cooking  Use low-heat cooking methods, such as baking, instead of high-heat cooking methods like deep frying.  Cook using healthy oils, such as olive, canola, or sunflower oil.  Avoid cooking with butter, cream, or high-fat meats. Meal planning  Eat meals and snacks regularly, preferably at the same times every day. Avoid going long periods of time without eating.  Eat foods that are high in fiber, such as fresh fruits, vegetables, beans, and whole grains. Talk with your dietitian about how many servings of carbs you can eat at each meal.  Eat 4-6 oz (112-168 g) of lean protein each day, such as lean meat, chicken, fish, eggs, or tofu. One ounce (oz) of lean protein is equal to: ? 1 oz (28 g) of meat, chicken, or fish. ? 1 egg. ?  cup (62 g) of tofu.  Eat some foods each day that contain healthy fats, such as avocado, nuts, seeds, and fish.   What foods should I eat? Fruits Berries. Apples. Oranges. Peaches. Apricots. Plums. Grapes. Mango. Papaya. Pomegranate. Kiwi. Cherries. Vegetables Lettuce. Spinach. Leafy greens, including kale, chard, collard greens, and mustard greens. Beets. Cauliflower. Cabbage. Broccoli. Carrots. Green beans. Tomatoes. Peppers. Onions. Cucumbers. Brussels sprouts. Grains Whole grains, such as whole-wheat or whole-grain bread, crackers, tortillas, cereal, and pasta. Unsweetened oatmeal. Quinoa. Brown or wild rice. Meats and other proteins Seafood. Poultry without skin. Lean cuts of poultry and beef. Tofu. Nuts. Seeds. Dairy Low-fat or fat-free dairy products such as milk, yogurt, and cheese. The items listed above may not be a complete list of foods and beverages you can eat. Contact a dietitian for more information. What foods should I avoid? Fruits Fruits canned with  syrup. Vegetables Canned vegetables. Frozen vegetables with butter or cream sauce. Grains Refined white flour and flour products such as bread, pasta, snack foods, and cereals. Avoid all processed foods. Meats and other proteins Fatty cuts of meat. Poultry with skin. Breaded or fried meats. Processed meat. Avoid saturated fats. Dairy Full-fat yogurt, cheese, or milk. Beverages Sweetened drinks, such as soda or iced tea. The items listed above may not be a complete list of foods and beverages you should avoid. Contact a dietitian for more information. Questions to ask a health care provider  Do I need to meet with a diabetes educator?  Do I need to meet with a dietitian?  What number can I call if I have questions?  When are the best times to check my blood glucose? Where to find more information:  American Diabetes Association: diabetes.org  Academy of Nutrition and Dietetics: www.eatright.org  National Institute of Diabetes and Digestive and Kidney Diseases: www.niddk.nih.gov  Association of Diabetes Care and Education Specialists: www.diabeteseducator.org Summary  It is important to have healthy eating   habits because your blood sugar (glucose) levels are greatly affected by what you eat and drink.  A healthy meal plan will help you control your blood glucose and maintain a healthy lifestyle.  Your health care provider may recommend that you work with a dietitian to make a meal plan that is best for you.  Keep in mind that carbohydrates (carbs) and alcohol have immediate effects on your blood glucose levels. It is important to count carbs and to use alcohol carefully. This information is not intended to replace advice given to you by your health care provider. Make sure you discuss any questions you have with your health care provider. Document Revised: 03/17/2019 Document Reviewed: 03/17/2019 Elsevier Patient Education  2021 Elsevier Inc.  

## 2020-06-03 LAB — IRON,TIBC AND FERRITIN PANEL
%SAT: 14 % (calc) — ABNORMAL LOW (ref 16–45)
Ferritin: 18 ng/mL (ref 16–288)
Iron: 52 ug/dL (ref 45–160)
TIBC: 364 mcg/dL (calc) (ref 250–450)

## 2020-06-03 LAB — COMPLETE METABOLIC PANEL WITH GFR
AG Ratio: 1.7 (calc) (ref 1.0–2.5)
ALT: 15 U/L (ref 6–29)
AST: 18 U/L (ref 10–35)
Albumin: 4.1 g/dL (ref 3.6–5.1)
Alkaline phosphatase (APISO): 77 U/L (ref 37–153)
BUN: 12 mg/dL (ref 7–25)
CO2: 27 mmol/L (ref 20–32)
Calcium: 9.6 mg/dL (ref 8.6–10.4)
Chloride: 103 mmol/L (ref 98–110)
Creat: 0.66 mg/dL (ref 0.50–0.99)
GFR, Est African American: 106 mL/min/{1.73_m2} (ref >=60)
GFR, Est Non African American: 91 mL/min/{1.73_m2} (ref >=60)
Globulin: 2.4 g/dL (calc) (ref 1.9–3.7)
Glucose, Bld: 186 mg/dL — ABNORMAL HIGH (ref 65–99)
Potassium: 4.1 mmol/L (ref 3.5–5.3)
Sodium: 139 mmol/L (ref 135–146)
Total Bilirubin: 0.4 mg/dL (ref 0.2–1.2)
Total Protein: 6.5 g/dL (ref 6.1–8.1)

## 2020-06-06 NOTE — Progress Notes (Signed)
Pat,  Iron level in normal range could be higher though.  Iron stores better but could be better.  You could increase iron intake or just double the dose of iron you are taking.  Kidney and liver look great!

## 2020-08-04 ENCOUNTER — Other Ambulatory Visit: Payer: Self-pay | Admitting: Physician Assistant

## 2020-08-04 DIAGNOSIS — E1165 Type 2 diabetes mellitus with hyperglycemia: Secondary | ICD-10-CM

## 2020-08-04 DIAGNOSIS — IMO0002 Reserved for concepts with insufficient information to code with codable children: Secondary | ICD-10-CM

## 2020-08-04 DIAGNOSIS — E1169 Type 2 diabetes mellitus with other specified complication: Secondary | ICD-10-CM

## 2020-08-05 ENCOUNTER — Other Ambulatory Visit: Payer: Self-pay | Admitting: Physician Assistant

## 2020-08-05 DIAGNOSIS — Z1231 Encounter for screening mammogram for malignant neoplasm of breast: Secondary | ICD-10-CM

## 2020-08-31 ENCOUNTER — Other Ambulatory Visit: Payer: Self-pay

## 2020-08-31 ENCOUNTER — Encounter: Payer: Self-pay | Admitting: Physician Assistant

## 2020-08-31 ENCOUNTER — Other Ambulatory Visit: Payer: Self-pay | Admitting: Physician Assistant

## 2020-08-31 ENCOUNTER — Ambulatory Visit (INDEPENDENT_AMBULATORY_CARE_PROVIDER_SITE_OTHER): Payer: Medicare Other | Admitting: Physician Assistant

## 2020-08-31 VITALS — BP 143/63 | HR 80 | Wt 194.0 lb

## 2020-08-31 DIAGNOSIS — I10 Essential (primary) hypertension: Secondary | ICD-10-CM

## 2020-08-31 DIAGNOSIS — E1169 Type 2 diabetes mellitus with other specified complication: Secondary | ICD-10-CM | POA: Diagnosis not present

## 2020-08-31 DIAGNOSIS — I6521 Occlusion and stenosis of right carotid artery: Secondary | ICD-10-CM | POA: Diagnosis not present

## 2020-08-31 DIAGNOSIS — E785 Hyperlipidemia, unspecified: Secondary | ICD-10-CM | POA: Diagnosis not present

## 2020-08-31 LAB — POCT GLYCOSYLATED HEMOGLOBIN (HGB A1C): Hemoglobin A1C: 8.2 % — AB (ref 4.0–5.6)

## 2020-08-31 MED ORDER — RYBELSUS 7 MG PO TABS: 7.0000 mg | ORAL_TABLET | Freq: Every day | ORAL | 0 refills | Status: DC

## 2020-08-31 MED ORDER — METFORMIN HCL 1000 MG PO TABS: 1000.0000 mg | ORAL_TABLET | Freq: Two times a day (BID) | ORAL | 0 refills | Status: DC

## 2020-08-31 MED ORDER — PIOGLITAZONE HCL 45 MG PO TABS: 45.0000 mg | ORAL_TABLET | Freq: Every day | ORAL | 0 refills | Status: DC

## 2020-08-31 MED ORDER — ROSUVASTATIN CALCIUM 10 MG PO TABS: 10.0000 mg | ORAL_TABLET | Freq: Every day | ORAL | 3 refills | Status: DC

## 2020-08-31 NOTE — Progress Notes (Signed)
Subjective:    Patient ID: Connie Cardenas, female    DOB: Mar 03, 1951, 70 y.o.   MRN: [id number]  HPI  Patient is a 70 year old obese female with type 2 diabetes, hypertension, hyperlipidemia, carotid artery stenosis who presents to the clinic for 25-month follow-up.  She is trying to be more consistent in taking her blood sugars.  Some mornings is as low as 111 and some mornings is as high as 160.  She denies any hypoglycemic events.  She denies any headaches, chest pain, shortness of breath.  She is finding it more difficult to see things up close.  She has an eye appointment scheduled in the next month.  She denies any open sores or wounds.  She is trying to watch her sugar and carb intake but is not perfect.  She is fairly active but does not exercise.  She is compliant with Actos, metformin but she has not been taking Januvia due to cost.  She did get it filled at the beginning of April.  She has been taking it for 1 month. .. Active Ambulatory Problems    Diagnosis Date Noted  . Dyslipidemia, goal LDL below 70 10/04/2015  . Diabetes mellitus (HCC) 10/04/2015  . Essential hypertension, benign 10/04/2015  . Obesity 01/04/2016  . Hyperlipidemia associated with type 2 diabetes mellitus (HCC) 04/14/2016  . Elevated liver enzymes 07/15/2017  . Systolic ejection murmur 07/15/2017  . Left carotid bruit 07/15/2017  . Liver cyst 04/08/2018  . Breast cyst, left 04/11/2018  . Moderate aortic valve stenosis 04/11/2018  . Bicuspid aortic valve 04/11/2018  . Carotid artery stenosis, asymptomatic, right 04/11/2018  . Swelling of left index finger 06/11/2018  . History of COVID-19 05/05/2019  . Chronic right shoulder pain 08/03/2019  . IDA (iron deficiency anemia) 11/13/2019  . Type II diabetes mellitus with complication, uncontrolled (HCC) 06/01/2020   Resolved Ambulatory Problems    Diagnosis Date Noted  . COVID-19 04/22/2019   Past Medical History:  Diagnosis Date  . Diabetes  mellitus without complication (HCC)   . Hypertension     Review of Systems  All other systems reviewed and are negative.      Objective:   Physical Exam Vitals reviewed.  Constitutional:      Appearance: Normal appearance. She is obese.  HENT:     Head: Normocephalic.  Neck:     Vascular: Carotid bruit present.  Cardiovascular:     Rate and Rhythm: Normal rate and regular rhythm.     Heart sounds: Murmur heard.    Pulmonary:     Effort: Pulmonary effort is normal.     Breath sounds: Normal breath sounds.  Neurological:     General: No focal deficit present.     Mental Status: [name], place, and time.  Psychiatric:        Mood and Affect: Mood normal.       .. Results for orders placed or performed in visit on 08/31/20  POCT glycosylated hemoglobin (Hb A1C)  Result Value Ref Range   Hemoglobin A1C 8.2 (A) 4.0 - 5.6 %   HbA1c POC (<> result, manual entry)     HbA1c, POC (prediabetic range)     HbA1c, POC (controlled diabetic range)         Assessment & Plan:  Marland KitchenMarland KitchenMaryelizabeth was seen today for diabetes.  Diagnoses and all orders for this visit:  Type 2 diabetes mellitus with other specified complication, without long-term current use of  insulin (HCC) -     POCT glycosylated hemoglobin (Hb A1C) -     metFORMIN (GLUCOPHAGE) 1000 MG tablet; Take 1 tablet (1,000 mg total) by mouth 2 (two) times daily with a meal. -     pioglitazone (ACTOS) 45 MG tablet; Take 1 tablet (45 mg total) by mouth daily.  Essential hypertension, benign  Hyperlipidemia associated with type 2 diabetes mellitus (HCC) -     rosuvastatin (CRESTOR) 10 MG tablet; Take 1 tablet (10 mg total) by mouth daily.  Carotid artery stenosis, asymptomatic, right -     rosuvastatin (CRESTOR) 10 MG tablet; Take 1 tablet (10 mg total) by mouth daily.  Hyperlipidemia associated with type 2 diabetes mellitus (HCC) -     rosuvastatin (CRESTOR) 10 MG tablet; Take 1 tablet (10 mg  total) by mouth daily.   A1C is down to 8.2 but still not to goal.  She just started [place] but reporting it is 400 dollars a month.  I would like for her to consider rybelsus to replace [place]. She does not want to do any injections.  I also think if we could get her set up on freestyle libre it would help her with glucose control.  Will send to pharmacist for review and to work with her on approval.  On ACE. BP not to goal but not taken BP medication this morning.  On statin.  Eye exam scheduled.  Foot exam UTD.  Declines covid.  Pneumonia declined.  Flu UTD.  Follow up in 3 months.

## 2020-09-14 NOTE — Progress Notes (Signed)
Chronic Care Management Pharmacy Note  09/14/2020 Name:  Connie Cardenas MRN:  [id number] DOB:  Oct 18, 1950  Subjective: Connie Cardenas is an 70 y.o. year old female who is a primary patient of Donella Stade, [place].  The CCM team was consulted for assistance with disease management and care coordination needs.     Engaged with patient face to face for initial visit in response to provider referral for pharmacy case management and/or care coordination services.   Consent to Services:  The patient was given the following information about Chronic Care Management services today, agreed to services, and gave verbal consent: 1. CCM service includes personalized support from designated clinical staff supervised by the primary care provider, including individualized plan of care and coordination with other care providers 2. 24/7 contact phone numbers for assistance for urgent and routine care needs. 3. Service will only be billed when office clinical staff spend 20 minutes or more in a month to coordinate care. 4. Only one practitioner may furnish and bill the service in a calendar month. 5.The patient may stop CCM services at any time (effective at the end of the month) by phone call to the office staff. 6. The patient will be responsible for cost sharing (co-pay) of up to 20% of the service fee (after annual deductible is met). Patient agreed to services and consent obtained.  Patient Care Team: Lavada Mesi as PCP - General (Family Medicine)  Recent office visits: 08/31/20 - [place] Planas (PCP): initiated rybelsus & rosuvastatin, dc januvia d/t cost, seeking CGM   Recent consult visits: 03/07/20 - Risa Grill (vasc surg): surveillance & f/u for carotid stenosis  Hospital visits: None in previous 6 months  Objective:  Lab Results  Component Value Date   CREATININE 0.66 06/03/2020   CREATININE 0.75 11/11/2019   CREATININE 0.69 05/05/2019    Lab Results   Component Value Date   HGBA1C 8.2 (A) 08/31/2020   Last diabetic Eye exam:  Lab Results  Component Value Date/Time   HMDIABEYEEXA No Retinopathy 10/30/2019 12:00 AM    Last diabetic Foot exam: No results found for: HMDIABFOOTEX      Component Value Date/Time   CHOL 136 11/11/2019 0821   TRIG 77 11/11/2019 0821   HDL 57 11/11/2019 0821   CHOLHDL 2.4 11/11/2019 0821   LDLCALC 63 11/11/2019 0821    Hepatic Function Latest Ref Rng & Units 06/03/2020 11/11/2019 05/05/2019  Total Protein 6.1 - 8.1 g/dL 6.5 6.3 6.9  Albumin 3.6 - 5.1 g/dL - - -  AST 10 - 35 U/L '18 18 27  ' ALT 6 - 29 U/L 15 15 39(H)  Alk Phosphatase 33 - 130 U/L - - -  Total Bilirubin 0.2 - 1.2 mg/dL 0.4 0.4 0.4    No results found for: TSH, FREET4  CBC Latest Ref Rng & Units 02/10/2020 11/11/2019  WBC 3.8 - 10.8 Thousand/uL 4.6 5.0  Hemoglobin 11.7 - 15.5 g/dL 11.7 11.0(L)  Hematocrit 35.0 - 45.0 % 35.7 33.6(L)  Platelets 140 - 400 Thousand/uL 278 256   Clinical ASCVD: Yes  The 10-year ASCVD risk score Mikey Bussing DC Jr., et al., 2013) is: 17.7%   Values used to calculate the score:     Age: 9 years     Sex: Female     Is Non-Hispanic African American: No     Diabetic: Yes     Tobacco smoker: No     Systolic Blood Pressure: 397 mmHg     Is  BP treated: Yes     HDL Cholesterol: 57 mg/dL     Total Cholesterol: 136 mg/dL    Social History   Tobacco Use  Smoking Status Never Smoker  Smokeless Tobacco Never Used   BP Readings from Last 3 Encounters:  08/31/20 (!) 143/63  06/01/20 128/78  03/07/20 114/74   Pulse Readings from Last 3 Encounters:  08/31/20 80  06/01/20 82  03/07/20 88   Wt Readings from Last 3 Encounters:  08/31/20 194 lb (88 kg)  06/01/20 196 lb (88.9 kg)  03/07/20 191 lb 8 oz (86.9 kg)    Assessment: Review of patient past medical history, allergies, medications, health status, including review of consultants reports, laboratory and other test data, was performed as part of  comprehensive evaluation and provision of chronic care management services.   SDOH:  (Social Determinants of Health) assessments and interventions performed:    CCM Care Plan  Allergies  Allergen Reactions  . Wilder Glade [Dapagliflozin]     Recurrent yeast infections.   . Penicillins     Medications Reviewed Today    Reviewed by Lavada Mesi (Physician Assistant) on 08/31/20 at [place] List Status: <None>  Medication Order Taking? Sig Documenting Provider Last Dose Status Informant  AMBULATORY NON FORMULARY MEDICATION [id number] Yes Glucometer lancets and test strips for DM to check 1-2 times a day. Donella Stade, PA-C Taking Active   Ascorbic Acid (VITA-C PO) [id number] Yes Take by mouth daily. [provider] Taking Active   Ascorbic Acid (VITAMIN C PO) [id number] Yes Take by mouth. [provider] Taking Active   aspirin 81 MG tablet [id number] Yes Take 81 mg by mouth daily. [provider] Taking Active   Cholecalciferol (VITAMIN D PO) [id number] Yes Take by mouth. [provider] Taking Active   diclofenac Sodium (VOLTAREN) 1 % GEL [id number] Yes Apply 4 g topically 4 (four) times daily. To affected joint. Donella Stade, PA-C Taking Active   Ferrous Sulfate (IRON PO) [id number] Yes Take by mouth in the morning, at noon, and at bedtime. [provider] Taking Active   KRILL OIL PO [id number] Yes Take by mouth daily. [provider] Taking Active   lisinopril (ZESTRIL) 5 MG tablet [id number] Yes Take 1 tablet (5 mg total) by mouth daily. Donella Stade, PA-C Taking Active   metFORMIN (GLUCOPHAGE) 1000 MG tablet [id number] Yes Take 1 tablet (1,000 mg total) by mouth 2 (two) times daily with a meal. appt for refills Donella Stade, PA-C Taking Active   Multiple Vitamins-Minerals (ZINC PO) [id number] Yes Take by mouth daily.  [provider] Taking Active   pioglitazone (ACTOS) 45 MG tablet [id number] Yes Take 1 tablet  (45 mg total) by mouth daily. Donella Stade, PA-C Taking Active   rosuvastatin (CRESTOR) 10 MG tablet [id number] Yes Take 1 tablet (10 mg total) by mouth daily. Donella Stade, PA-C Taking Active   sitaGLIPtin (JANUVIA) 100 MG tablet [id number] Yes Take 1 tablet (100 mg total) by mouth daily. Donella Stade, PA-C Taking Active           Patient Active Problem List   Diagnosis Date Noted  . Type II diabetes mellitus with complication, uncontrolled ([place]) 06/01/2020  . IDA (iron deficiency anemia) 11/13/2019  . Chronic right shoulder pain 08/03/2019  . History of COVID-19 05/05/2019  . Swelling of left index finger 06/11/2018  . Breast cyst, left 04/11/2018  . Moderate aortic valve stenosis 04/11/2018  . Bicuspid  aortic valve 04/11/2018  . Carotid artery stenosis, asymptomatic, right 04/11/2018  . Liver cyst 04/08/2018  . Elevated liver enzymes 07/15/2017  . Systolic ejection murmur 35/24/8185  . Left carotid bruit 07/15/2017  . Hyperlipidemia associated with type 2 diabetes mellitus ([employer]) 04/14/2016  . Obesity 01/04/2016  . Dyslipidemia, goal LDL below 70 10/04/2015  . Diabetes mellitus ([place]) 10/04/2015  . Essential hypertension, benign 10/04/2015     There is no immunization history on file for this patient.  Conditions to be addressed/monitored: HTN, HLD and DMII  There are no care plans that you recently modified to display for this patient.   Medication Assistance: None required.  Patient affirms current coverage meets needs.  Patient's preferred pharmacy is:  [id number] - [place], [place] - [address] [place] [address] Phone: [phone] Fax: [phone]   Follow Up:  Patient agrees to Care Plan and Follow-up.  Plan: Telephone follow up appointment with care management team member scheduled for:  1 month  Darius Bump

## 2020-09-15 ENCOUNTER — Other Ambulatory Visit: Payer: Self-pay

## 2020-09-15 ENCOUNTER — Telehealth: Payer: Self-pay | Admitting: Neurology

## 2020-09-15 ENCOUNTER — Ambulatory Visit: Payer: Medicare Other | Admitting: Pharmacist

## 2020-09-15 DIAGNOSIS — I10 Essential (primary) hypertension: Secondary | ICD-10-CM

## 2020-09-15 DIAGNOSIS — I6521 Occlusion and stenosis of right carotid artery: Secondary | ICD-10-CM

## 2020-09-15 DIAGNOSIS — E1169 Type 2 diabetes mellitus with other specified complication: Secondary | ICD-10-CM

## 2020-09-15 DIAGNOSIS — E785 Hyperlipidemia, unspecified: Secondary | ICD-10-CM

## 2020-09-15 NOTE — Patient Instructions (Signed)
Visit Information   PATIENT GOALS:  Goals Addressed            This Visit's Progress   . Medication Management       Patient Goals/Self-Care Activities . Over the next 30 days, patient will:  take medications as prescribed and check glucose 2x per day (AM/fasting, and ~2hr after a meal), document, and provide at future appointments.  Check blood pressure 1x per week, document, and provide at future appointments.  Follow Up Plan: Telephone follow up appointment with care management team member scheduled for:  1 month        Consent to CCM Services: Ms. Desanto was given information about Chronic Care Management services today including:  1. CCM service includes personalized support from designated clinical staff supervised by her physician, including individualized plan of care and coordination with other care providers 2. 24/7 contact phone numbers for assistance for urgent and routine care needs. 3. Service will only be billed when office clinical staff spend 20 minutes or more in a month to coordinate care. 4. Only one practitioner may furnish and bill the service in a calendar month. 5. The patient may stop CCM services at any time (effective at the end of the month) by phone call to the office staff. 6. The patient will be responsible for cost sharing (co-pay) of up to 20% of the service fee (after annual deductible is met).  Patient agreed to services and verbal consent obtained.   Patient verbalizes understanding of instructions provided today and agrees to view in [place].   Telephone follow up appointment with care management team member scheduled for: 1 month  Ada: Patient Care Plan: Medication Management    Problem Identified: DM, HTN, HLD     Long-Range Goal: Disease Progression Prevention   Start Date: 09/15/2020  This Visit's Progress: On track  Priority: High  Note:   Current Barriers:  . Unable to achieve control of  diabetes  . Suboptimal therapeutic regimen for hyperlipidemia/ASCVD risk management  Pharmacist Clinical Goal(s):  Marland Kitchen Over the next 30 days, patient will adhere to plan to optimize therapeutic regimen for diabetes and hyperlipidemia as evidenced by report of adherence to recommended medication management changes through collaboration with PharmD and provider.   Interventions: . 1:1 collaboration with Donella Stade, PA-C regarding development and update of comprehensive plan of care as evidenced by provider attestation and co-signature . Inter-disciplinary care team collaboration (see longitudinal plan of care) . Comprehensive medication review performed; medication list updated in electronic medical record  Diabetes: . Uncontrolled/controlled; current treatment: metformin 1g BID, pioglitazone 46m daily, semaglutide PO (Rybelsus) 396mdaily, ;  . Current glucose readings: fasting glucose: 143, post prandial glucose: not checking . Denies/reports hypoglycemic/hyperglycemic symptoms . Current meal patterns: breakfast: coffee (no appetite), sometimes eggs w/cheese; lunch: cottage cheese w/fruit, sometimes sandwich w/low-cal bread ; dinner: soup, meat +sides, mindful of potatoes/pasta; snacks: cheese + crackers, granola bar (watches sugar); drinks: water, 1/2 sweet/1/2 unsweet ice tea,  . Current exercise: attempts walking, watches young grandkids . Counseled on realistic diet modification/choices/moderation, medication side effects & mechanism of action, and plan for titration of rybelsus dose. Recommended finish Rybelsus, fill medication for Rybelsus 12m20mt pharmacy, and then be ready for 55m44mmonth after that. Patient checked copay which is $21 for 90DS,   Hypertension: . Uncontrolled/controlled; current treatment: lisinopril 5mg 31mly;  . Current home readings: not checking . Denies hypotensive/hypertensive symptoms . Recommended checking BP  once per week, writing down numbers for review  at next appointment and  Hyperlipidemia: . Controlled; current treatment: rosuvastatin 42m;  . Medications previously tried: N/A . Recommended increase to 290mas tolerated (regardless of LDL) due to age <75, hx DM, and atherosclerotic etiology (carotid stenosis), discontinue Krill oil as statin medication works well for multiple types of cholesterol/triglycerides.     Patient Goals/Self-Care Activities . Over the next 30 days, patient will:  take medications as prescribed and check glucose 2x per day (AM/fasting, and ~2hr after a meal), document, and provide at future appointments  Follow Up Plan: Telephone follow up appointment with care management team member scheduled for:  1 month

## 2020-09-15 NOTE — Telephone Encounter (Signed)
Prior Authorization for Rybelsus submitted via covermymeds.  Request Reference Number: [id number]. RYBELSUS TAB 7MG  is approved through 04/22/2021. Your patient may now fill this prescription and it will be covered.

## 2020-09-29 ENCOUNTER — Other Ambulatory Visit: Payer: Self-pay

## 2020-09-29 ENCOUNTER — Ambulatory Visit
Admission: RE | Admit: 2020-09-29 | Discharge: 2020-09-29 | Disposition: A | Discharge status: Home / Self Care | Payer: Medicare Other | Source: Ambulatory Visit | Attending: Physician Assistant | Admitting: Physician Assistant

## 2020-09-29 DIAGNOSIS — Z1231 Encounter for screening mammogram for malignant neoplasm of breast: Secondary | ICD-10-CM

## 2020-10-02 ENCOUNTER — Encounter: Payer: Self-pay | Admitting: Physician Assistant

## 2020-10-05 ENCOUNTER — Ambulatory Visit (INDEPENDENT_AMBULATORY_CARE_PROVIDER_SITE_OTHER): Payer: Medicare Other | Admitting: Pharmacist

## 2020-10-05 ENCOUNTER — Other Ambulatory Visit: Payer: Self-pay

## 2020-10-05 DIAGNOSIS — E1169 Type 2 diabetes mellitus with other specified complication: Secondary | ICD-10-CM

## 2020-10-05 DIAGNOSIS — E785 Hyperlipidemia, unspecified: Secondary | ICD-10-CM | POA: Diagnosis not present

## 2020-10-05 DIAGNOSIS — E119 Type 2 diabetes mellitus without complications: Secondary | ICD-10-CM

## 2020-10-05 DIAGNOSIS — I10 Essential (primary) hypertension: Secondary | ICD-10-CM

## 2020-10-05 MED ORDER — ROSUVASTATIN CALCIUM 20 MG PO TABS: 20.0000 mg | ORAL_TABLET | Freq: Every day | ORAL | 3 refills | Status: DC

## 2020-10-05 MED ORDER — METFORMIN HCL 1000 MG PO TABS: 1000.0000 mg | ORAL_TABLET | Freq: Two times a day (BID) | ORAL | 3 refills | Status: DC

## 2020-10-05 MED ORDER — PIOGLITAZONE HCL 45 MG PO TABS: 45.0000 mg | ORAL_TABLET | Freq: Every day | ORAL | 3 refills | Status: DC

## 2020-10-05 MED ORDER — RYBELSUS 3 MG PO TABS: 3.0000 mg | ORAL_TABLET | Freq: Every day | ORAL | 0 refills | Status: DC

## 2020-10-05 MED ORDER — LISINOPRIL 5 MG PO TABS: 5.0000 mg | ORAL_TABLET | Freq: Every day | ORAL | 3 refills | Status: DC

## 2020-10-05 NOTE — Progress Notes (Signed)
Chronic Care Management Pharmacy Note  10/05/2020 Name:  Connie Cardenas MRN:  [id number] DOB:  1950/05/18  Summary: addressed DM, HTN, HLD. Initiated Rybelsus patient assistance program, also discussed RX needs for 90DS x2 as patient is traveling to [place] end of June through Nov 2022.  Recommendations/Changes made from today's visit: needs RX for Lisinopril 48m, Meformin 1g BID, Pioglitazone 4673mdaily, rybelsus 73m66mand rosuvastatin 82m37mlan: f/u with pharmacist in July, pending communication logistics w/pt traveling abroad  Subjective: PatrLulia Schrineran 69 y64. year old female who is a primary patient of BreeDonella Stade-C.  The CCM team was consulted for assistance with disease management and care coordination needs.    Engaged with patient face to face for follow up visit in response to provider referral for pharmacy case management and/or care coordination services.   Consent to Services:  The patient was given information about Chronic Care Management services, agreed to services, and gave verbal consent prior to initiation of services.  Please see initial visit note for detailed documentation.   Patient Care Team: BreeLavada MesiPCP - General (Family Medicine)   Objective:  Lab Results  Component Value Date   CREATININE 0.66 06/03/2020   CREATININE 0.75 11/11/2019   CREATININE 0.69 05/05/2019    Lab Results  Component Value Date   HGBA1C 8.2 (A) 08/31/2020   Last diabetic Eye exam:  Lab Results  Component Value Date/Time   HMDIABEYEEXA No Retinopathy 10/30/2019 12:00 AM        Component Value Date/Time   CHOL 136 11/11/2019 0821   TRIG 77 11/11/2019 0821   HDL 57 11/11/2019 0821   CHOLHDL 2.4 11/11/2019 0821   LDLCALC 63 11/11/2019 0821    Hepatic Function Latest Ref Rng & Units 06/03/2020 11/11/2019 05/05/2019  Total Protein 6.1 - 8.1 g/dL 6.5 6.3 6.9  Albumin 3.6 - 5.1 g/dL - - -  AST 10 - 35 U/L _0 ALT 6 -  29 U/L 15 15 39(H)  Alk Phosphatase 33 - 130 U/L - - -  Total Bilirubin 0.2 - 1.2 mg/dL 0.4 0.4 0.4    CBC Latest Ref Rng & Units 02/10/2020 11/11/2019  WBC 3.8 - 10.8 Thousand/uL 4.6 5.0  Hemoglobin 11.7 - 15.5 g/dL 11.7 11.0(L)  Hematocrit 35.0 - 45.0 % 35.7 33.6(L)  Platelets 140 - 400 Thousand/uL 278 256    Clinical ASCVD: Yes  The 10-year ASCVD risk score (GofMikey BussingJr., et al., 2013) is: 17.7%   Values used to calculate the score:     Age: 43 y32rs     Sex: Female     Is Non-Hispanic African American: No     Diabetic: Yes     Tobacco smoker: No     Systolic Blood Pressure: 143 801g     Is BP treated: Yes     HDL Cholesterol: 57 mg/dL     Total Cholesterol: 136 mg/dL    Other: (CHADS2VASc if Afib, PHQ9 if depression, MMRC or CAT for COPD, ACT, DEXA)  Social History   Tobacco Use  Smoking Status Never  Smokeless Tobacco Never   BP Readings from Last 3 Encounters:  08/31/20 (!) 143/63  06/01/20 128/78  03/07/20 114/74   Pulse Readings from Last 3 Encounters:  08/31/20 80  06/01/20 82  03/07/20 88   Wt Readings from Last 3 Encounters:  08/31/20 194 lb (88 kg)  06/01/20 196 lb (88.9 kg)  03/07/20  191 lb 8 oz (86.9 kg)    Assessment: Review of patient past medical history, allergies, medications, health status, including review of consultants reports, laboratory and other test data, was performed as part of comprehensive evaluation and provision of chronic care management services.   SDOH:  (Social Determinants of Health) assessments and interventions performed:    CCM Care Plan  Allergies  Allergen Reactions   Farxiga [Dapagliflozin]     Recurrent yeast infections.    Penicillins     Medications Reviewed Today     Reviewed by Darius Bump, [facility] (Pharmacist) on 10/05/20 at 1241  Med List Status: <None>   Medication Order Taking? Sig Documenting Provider Last Dose Status Informant  AMBULATORY NON FORMULARY MEDICATION [id number] Yes Glucometer lancets  and test strips for DM to check 1-2 times a day. Donella Stade, PA-C Taking Active   Ascorbic Acid (VITAMIN C PO) [id number] Yes Take by mouth. [provider] Taking Active   aspirin 81 MG tablet [id number] Yes Take 81 mg by mouth daily. [provider] Taking Active   Cholecalciferol (VITAMIN D PO) [id number] Yes Take by mouth. [provider] Taking Active   diclofenac Sodium (VOLTAREN) 1 % GEL [id number] No Apply 4 g topically 4 (four) times daily. To affected joint.  Patient not taking: No sig reported   Donella Stade, PA-C Not Taking Active   Ferrous Sulfate (IRON PO) [id number] Yes Take by mouth in the morning, at noon, and at bedtime. [provider] Taking Active   KRILL OIL PO [id number] Yes Take by mouth daily. [provider] Taking Active   lisinopril (ZESTRIL) 5 MG tablet [id number] Yes Take 1 tablet (5 mg total) by mouth daily. Donella Stade, PA-C Taking Active   metFORMIN (GLUCOPHAGE) 1000 MG tablet [id number] Yes Take 1 tablet (1,000 mg total) by mouth 2 (two) times daily with a meal. [name], [name], PA-C Taking Active   pioglitazone (ACTOS) 45 MG tablet [id number] Yes Take 1 tablet (45 mg total) by mouth daily. Donella Stade, PA-C Taking Active   rosuvastatin (CRESTOR) 10 MG tablet [id number] Yes Take 1 tablet (10 mg total) by mouth daily. [name], [name], PA-C Taking Active   Semaglutide (RYBELSUS) 7 MG TABS [id number] No Take 7 mg by mouth daily.  Patient not taking: Reported on 10/05/2020   Donella Stade, PA-C Not Taking Active             Patient Active Problem List   Diagnosis Date Noted   Type II diabetes mellitus with complication, uncontrolled ([place]) 06/01/2020   IDA (iron deficiency anemia) 11/13/2019   Chronic right shoulder pain 08/03/2019   History of COVID-19 05/05/2019   Swelling of left index finger 06/11/2018   Breast cyst, left 04/11/2018   Moderate aortic valve stenosis 04/11/2018   Bicuspid aortic  valve 04/11/2018   Carotid artery stenosis, asymptomatic, right 04/11/2018   Liver cyst 04/08/2018   Elevated liver enzymes 85/27/7824   Systolic ejection murmur 23/53/6144   Left carotid bruit 07/15/2017   Hyperlipidemia associated with type 2 diabetes mellitus ([place]) 04/14/2016   Obesity 01/04/2016   Dyslipidemia, goal LDL below 70 10/04/2015   Diabetes mellitus ([place]) 10/04/2015   Essential hypertension, benign 10/04/2015     There is no immunization history on file for this patient.  Conditions to be addressed/monitored: HTN, HLD, and DMII  Care Plan : Medication Management  Updates made by Darius Bump, [place] since 10/05/2020 12:00 AM  Problem: DM, HTN, HLD      Long-Range Goal: Disease Progression Prevention   Start Date: 09/15/2020  Recent Progress: On track  Priority: High  Note:   Current Barriers:  Unable to achieve control of diabetes  Suboptimal therapeutic regimen for hyperlipidemia/ASCVD risk management  Pharmacist Clinical Goal(s):  Over the next 30 days, patient will adhere to plan to optimize therapeutic regimen for diabetes and hyperlipidemia as evidenced by report of adherence to recommended medication management changes through collaboration with PharmD and provider.   Interventions: 1:1 collaboration with Donella Stade, PA-C regarding development and update of comprehensive plan of care as evidenced by provider attestation and co-signature Inter-disciplinary care team collaboration (see longitudinal plan of care) Comprehensive medication review performed; medication list updated in electronic medical record  Diabetes:  Uncontrolled/controlled; current treatment: metformin 1g BID, pioglitazone 52m daily, semaglutide PO (Rybelsus) 325mdaily, (ran out of rybelsus samples, new RX for 30m98mas cost-prohibitive);   Current glucose readings: fasting glucose: 128, 130s, post prandial glucose: not checking  Denies/reports hypoglycemic/hyperglycemic  symptoms  Previously discussed meal patterns: breakfast: coffee (no appetite), sometimes eggs w/cheese; lunch: cottage cheese w/fruit, sometimes sandwich w/low-cal bread ; dinner: soup, meat +sides, mindful of potatoes/pasta; snacks: cheese + crackers, granola bar (watches sugar); drinks: water, 1/2 sweet/1/2 unsweet ice tea,   Current exercise: attempts walking, watches young grandkids  Counseled on realistic diet modification/choices/moderation, medication side effects & mechanism of action, and plan for titration of rybelsus dose. Recommended restart rybelsus 3mg34mily, PAP via Novonordisk pending,   Hypertension:  Controlled; current treatment: lisinopril 5mg 530mly;   Current home readings: not checking  Denies hypotensive/hypertensive symptoms  Recommended checking BP once per week, writing down numbers for review at next appointment and continue current regimen  Hyperlipidemia:  Controlled; current treatment: rosuvastatin 20mg 69m of the 10mg t64mts);   Medications previously tried: N/A  Recommended continue rosuvastatin 20mg as2merated (regardless of LDL) due to age <75, hx DM, and atherosclerotic etiology (carotid stenosis), discontinue Krill oil as statin medication works well for multiple types of cholesterol/triglycerides.     Patient Goals/Self-Care Activities Over the next 30 days, patient will:  take medications as prescribed and check glucose 2x per day (AM/fasting, and ~2hr after a meal), document, and provide at future appointments  Follow Up Plan: Telephone follow up appointment with care management team member scheduled for:  1 month      Medication Assistance: Application for rybelsus (novonordisk)  medication assistance program. in process.  Anticipated assistance start date 11/04/20.  See plan of care for additional detail.  [id number] [phone] Up:  Patient agrees to Care Plan and Follow-up.  Plan: Telephone follow up appointment with care management team member scheduled for:  1 month , TBD pending communication logistics while patient is abroad

## 2020-10-05 NOTE — Patient Instructions (Signed)
Patty,  - Updated RX's should be sent to pharmacy - Try to use the rybelsus voucher for ONE MONTH supply of the 3mg  at the pharmacy - Bring the income documents for paperwork - Still have your family pick up a 2nd set of medication prior to their travel over  Send a mychart if you need anything in the meantime! [facility]   Visit Information   Patient verbalizes understanding of instructions provided today and agrees to view in MyChart.   Telephone follow up appointment with care management team member scheduled for: July 1, pending communication w/patient during travel abroad

## 2020-10-06 ENCOUNTER — Telehealth: Payer: Self-pay

## 2020-10-06 ENCOUNTER — Telehealth: Payer: Self-pay | Admitting: Pharmacist

## 2020-10-06 NOTE — Telephone Encounter (Signed)
Patient signed forms. Complete. Faxed to [employer] at [phone] with confirmation received. Copy to scan.

## 2020-10-06 NOTE — Telephone Encounter (Signed)
Coordinating medication access to rybelsus around patient's upcoming international travel for approximately 5-6 months.

## 2020-10-06 NOTE — Telephone Encounter (Signed)
Spoke with front desk and she did not sign paperwork. They are contacting patient to come back to sign. Will fax after received.

## 2020-10-06 NOTE — Telephone Encounter (Signed)
Pt dropped off a folder with her new benefit amount . She stated that you need this information. You were not here. Paperwork was placed in folder.  tvt

## 2020-10-18 MED ORDER — RYBELSUS 14 MG PO TABS: 14.0000 mg | ORAL_TABLET | Freq: Every day | ORAL | 1 refills | Status: DC

## 2020-10-18 NOTE — Addendum Note (Signed)
Addended byJomarie Longs on: 10/18/2020 11:08 AM   Modules accepted: Orders

## 2020-10-21 ENCOUNTER — Telehealth: Payer: Medicare Other

## 2020-10-31 ENCOUNTER — Telehealth: Payer: Self-pay | Admitting: Pharmacist

## 2020-10-31 NOTE — Telephone Encounter (Signed)
Attempted to return call from Connie Cardenas who left an office VM for the pharmacist.   No answer, phone was off - left a voicemail to either re-attempt to call office, or send a [employer].

## 2020-11-30 ENCOUNTER — Ambulatory Visit: Payer: Medicare Other | Admitting: Physician Assistant

## 2021-01-27 ENCOUNTER — Ambulatory Visit: Payer: Medicare Other

## 2021-03-09 ENCOUNTER — Ambulatory Visit (INDEPENDENT_AMBULATORY_CARE_PROVIDER_SITE_OTHER): Payer: Medicare Other | Admitting: Pharmacist

## 2021-03-09 ENCOUNTER — Other Ambulatory Visit: Payer: Self-pay

## 2021-03-09 DIAGNOSIS — I10 Essential (primary) hypertension: Secondary | ICD-10-CM

## 2021-03-09 DIAGNOSIS — E1169 Type 2 diabetes mellitus with other specified complication: Secondary | ICD-10-CM

## 2021-03-09 NOTE — Progress Notes (Signed)
Chronic Care Management Pharmacy Note  03/10/2021 Name:  Connie Cardenas MRN:  [id number] DOB:  [birth year]  Summary: addressed DM, HTN, HLD. Initiated rybelsus 70m using samples & completed patient assistance application, however missing status update & patient ran out in October while [place]  Recommendations/Changes made from today's visit: none, continue current medications & follow up with PCP for updated a1c check. Pharmacist and support staff will investigate rybelsus patient assistance status & may re-apply for 2023 year.   Plan: f/u with pharmacist in 1 month  Subjective: PKhylei Wilmsis an 70y.o. year old female who is a primary patient of BDonella Stade PA-C.  The CCM team was consulted for assistance with disease management and care coordination needs.    Engaged with patient by telephone for follow up visit in response to provider referral for pharmacy case management and/or care coordination services.   Consent to Services:  The patient was given information about Chronic Care Management services, agreed to services, and gave verbal consent prior to initiation of services.  Please see initial visit note for detailed documentation.   Patient Care Team: BLavada Mesias PCP - General (Family Medicine)   Objective:  Lab Results  Component Value Date   CREATININE 0.66 06/03/2020   CREATININE 0.75 11/11/2019   CREATININE 0.69 05/05/2019    Lab Results  Component Value Date   HGBA1C 8.2 (A) 08/31/2020   Last diabetic Eye exam:  Lab Results  Component Value Date/Time   HMDIABEYEEXA No Retinopathy 10/30/2019 12:00 AM        Component Value Date/Time   CHOL 136 11/11/2019 0821   TRIG 77 11/11/2019 0821   HDL 57 11/11/2019 0821   CHOLHDL 2.4 11/11/2019 0821   LDLCALC 63 11/11/2019 0821    Hepatic Function Latest Ref Rng & Units 06/03/2020 11/11/2019 05/05/2019  Total Protein 6.1 - 8.1 g/dL 6.5 6.3  6.9  Albumin 3.6 - 5.1 g/dL - - -  AST 10 - 35 U/L _0 ALT 6 - 29 U/L 15 15 39(H)  Alk Phosphatase 33 - 130 U/L - - -  Total Bilirubin 0.2 - 1.2 mg/dL 0.4 0.4 0.4    CBC Latest Ref Rng & Units 02/10/2020 11/11/2019  WBC 3.8 - 10.8 Thousand/uL 4.6 5.0  Hemoglobin 11.7 - 15.5 g/dL 11.7 11.0(L)  Hematocrit 35.0 - 45.0 % 35.7 33.6(L)  Platelets 140 - 400 Thousand/uL 278 256    Clinical ASCVD: Yes  The 10-year ASCVD risk score (Arnett DK, et al., 2019) is: 17.7%   Values used to calculate the score:     Age: [age]years     Sex: Female     Is Non-Hispanic African American: No     Diabetic: Yes     Tobacco smoker: No     Systolic Blood Pressure: 1982mmHg     Is BP treated: Yes     HDL Cholesterol: 57 mg/dL     Total Cholesterol: 136 mg/dL    Other: (CHADS2VASc if Afib, PHQ9 if depression, MMRC or CAT for COPD, ACT, DEXA)  Social History   Tobacco Use  Smoking Status Never  Smokeless Tobacco Never   BP Readings from Last 3 Encounters:  08/31/20 (!) 143/63  06/01/20 128/78  03/07/20 114/74   Pulse Readings from Last 3 Encounters:  08/31/20 80  06/01/20 82  03/07/20 88   Wt Readings from Last 3 Encounters:  08/31/20  194 lb (88 kg)  06/01/20 196 lb (88.9 kg)  03/07/20 191 lb 8 oz (86.9 kg)    Assessment: Review of patient past medical history, allergies, medications, health status, including review of consultants reports, laboratory and other test data, was performed as part of comprehensive evaluation and provision of chronic care management services.   SDOH:  (Social Determinants of Health) assessments and interventions performed:    CCM Care Plan  Allergies  Allergen Reactions   Farxiga [Dapagliflozin]     Recurrent yeast infections.    Penicillins     Medications Reviewed Today     Reviewed by Darius Bump, [facility] (Pharmacist) on 03/09/21 at [phone]  Med List Status: <None>   Medication Order Taking? Sig Documenting Provider Last Dose Status Informant   AMBULATORY NON FORMULARY MEDICATION [id number] Yes Glucometer lancets and test strips for DM to check 1-2 times a day. Donella Stade, PA-C Taking Active   Ascorbic Acid (VITAMIN C PO) [id number] Yes Take by mouth. [provider] Taking Active   aspirin 81 MG tablet [id number] Yes Take 81 mg by mouth daily. [provider] Taking Active   Cholecalciferol (VITAMIN D PO) [id number] Yes Take by mouth. [provider] Taking Active   Ferrous Sulfate (IRON PO) [id number] Yes Take 1 tablet by mouth 3 (three) times a week. [provider] Taking Active   lisinopril (ZESTRIL) 5 MG tablet [id number] Yes Take 1 tablet (5 mg total) by mouth daily. Donella Stade, PA-C Taking Active   metFORMIN (GLUCOPHAGE) 1000 MG tablet [id number] Yes Take 1 tablet (1,000 mg total) by mouth 2 (two) times daily with a meal. [name], [name], PA-C Taking Active   pioglitazone (ACTOS) 45 MG tablet [id number] Yes Take 1 tablet (45 mg total) by mouth daily. Donella Stade, PA-C Taking Active   rosuvastatin (CRESTOR) 20 MG tablet [id number] Yes Take 1 tablet (20 mg total) by mouth daily.  Patient taking differently: Take 20 mg by mouth every other day.   [name], [name], PA-C Taking Active   Semaglutide (RYBELSUS) 14 MG TABS [id number] No Take 14 mg by mouth daily.  Patient not taking: Reported on 03/09/2021   Donella Stade, PA-C Not Taking Active             Patient Active Problem List   Diagnosis Date Noted   Type II diabetes mellitus with complication, uncontrolled 06/01/2020   IDA (iron deficiency anemia) 11/13/2019   Chronic right shoulder pain 08/03/2019   History of COVID-19 05/05/2019   Swelling of left index finger 06/11/2018   Breast cyst, left 04/11/2018   Moderate aortic valve stenosis 04/11/2018   Bicuspid aortic valve 04/11/2018   Carotid artery stenosis, asymptomatic, right 04/11/2018   Liver cyst 04/08/2018   Elevated liver enzymes 99/24/2683   Systolic  ejection murmur 07/15/2017   Left carotid bruit 07/15/2017   Hyperlipidemia associated with type 2 diabetes mellitus ([place]) 04/14/2016   Obesity 01/04/2016   Dyslipidemia, goal LDL below 70 10/04/2015   Diabetes mellitus ([place]) 10/04/2015   Essential hypertension, benign 10/04/2015     There is no immunization history on file for this patient.  Conditions to be addressed/monitored: HTN, HLD, and DMII  Care Plan : Medication Management  Updates made by Darius Bump, [place] since 03/10/2021 12:00 AM     Problem: DM, HTN, HLD      Long-Range Goal: Disease Progression Prevention   Start Date: 09/15/2020  Recent Progress: On track  Priority: High  Note:   Current Barriers:  Unable to achieve control of diabetes  Suboptimal therapeutic regimen for hyperlipidemia/ASCVD risk management  Pharmacist Clinical Goal(s):  Over the next 30 days, patient will adhere to plan to optimize therapeutic regimen for diabetes and hyperlipidemia as evidenced by report of adherence to recommended medication management changes through collaboration with PharmD and provider.   Interventions: 1:1 collaboration with Donella Stade, PA-C regarding development and update of comprehensive plan of care as evidenced by provider attestation and co-signature Inter-disciplinary care team collaboration (see longitudinal plan of care) Comprehensive medication review performed; medication list updated in electronic medical record  Diabetes:  Uncontrolled; current treatment: metformin 1g BID, pioglitazone 58m daily;   Current glucose readings: fasting glucose: 128, 130s, post prandial glucose: not checking  Denies/reports hypoglycemic/hyperglycemic symptoms  Previously discussed meal patterns: breakfast: coffee (no appetite), sometimes eggs w/cheese; lunch: cottage cheese w/fruit, sometimes sandwich w/low-cal bread ; dinner: soup, meat +sides, mindful of potatoes/pasta; snacks: cheese + crackers, granola bar  (watches sugar); drinks: water, 1/2 sweet/1/2 unsweet ice tea,   Current exercise: attempts walking, watches young grandkids  Counseled on realistic diet modification/choices/moderation, medication side effects & mechanism of action, and plan for titration of rybelsus dose. Recommended continue current medications, will investigate rybelsus patient assistance status.,   Hypertension:  Controlled; current treatment: lisinopril 580mdaily;   Current home readings: not checking  Denies hypotensive/hypertensive symptoms  Recommended checking BP once per week, writing down numbers for review at next appointment and continue current regimen  Hyperlipidemia:  Controlled; current treatment: rosuvastatin 2053mtwo of the 32m9mblets);   Medications previously tried: N/A  Recommended continue rosuvastatin 20mg32mtolerated (regardless of LDL) due to age <75, <101DM, and atherosclerotic etiology (carotid stenosis)    Patient Goals/Self-Care Activities Over the next 30 days, patient will:  take medications as prescribed and check glucose 2x per day (AM/fasting, and ~2hr after a meal), document, and provide at future appointments  Follow Up Plan: Telephone follow up appointment with care management team member scheduled for:  1 month       Medication Assistance: Application for rybelsus (novonordisk)  medication assistance program. in process.  Anticipated assistance start date 11/04/20.  See plan of care for additional detail.  Patient[facility] pharmacy [facility]:  [id number]: [phone] Up:  Patient agrees to Care Plan and Follow-up.  Plan: Telephone follow up appointment with care management team member scheduled for:  1 month   KeeshLarinda ButteryrmD Clinical Pharmacist [phone]

## 2021-03-10 NOTE — Patient Instructions (Signed)
Visit Information  Thank you for taking time to visit with me today. Please don't hesitate to contact me if I can be of assistance to you before our next scheduled telephone appointment.  Telephone follow up appointment with care management team member scheduled for: 1 month  If you need to cancel or re-schedule our visit, please call [phone] and our care guide team will be happy to assist you.  Following is a list of the goals we discussed today:  Patient Goals/Self-Care Activities Over the next 30 days, patient will:  take medications as prescribed and check glucose 2x per day (AM/fasting, and ~2hr after a meal), document, and provide at future appointments.  Check blood pressure 1x per week, document, and provide at future appointments.  Follow Up Plan: Telephone follow up appointment with care management team member scheduled for:  1 month   Patient verbalizes understanding of instructions provided today and agrees to view in MyChart.   Gabriel Carina

## 2021-03-20 ENCOUNTER — Encounter: Payer: Self-pay | Admitting: Physician Assistant

## 2021-03-20 ENCOUNTER — Ambulatory Visit (INDEPENDENT_AMBULATORY_CARE_PROVIDER_SITE_OTHER): Payer: Medicare Other | Admitting: Physician Assistant

## 2021-03-20 ENCOUNTER — Other Ambulatory Visit: Payer: Self-pay

## 2021-03-20 VITALS — BP 150/63 | HR 77 | Temp 97.9°F | Ht 65.0 in | Wt 192.0 lb

## 2021-03-20 DIAGNOSIS — I8391 Asymptomatic varicose veins of right lower extremity: Secondary | ICD-10-CM | POA: Diagnosis not present

## 2021-03-20 DIAGNOSIS — I6521 Occlusion and stenosis of right carotid artery: Secondary | ICD-10-CM | POA: Diagnosis not present

## 2021-03-20 DIAGNOSIS — E611 Iron deficiency: Secondary | ICD-10-CM | POA: Insufficient documentation

## 2021-03-20 DIAGNOSIS — I10 Essential (primary) hypertension: Secondary | ICD-10-CM

## 2021-03-20 DIAGNOSIS — I350 Nonrheumatic aortic (valve) stenosis: Secondary | ICD-10-CM | POA: Diagnosis not present

## 2021-03-20 DIAGNOSIS — E1169 Type 2 diabetes mellitus with other specified complication: Secondary | ICD-10-CM | POA: Diagnosis not present

## 2021-03-20 DIAGNOSIS — L84 Corns and callosities: Secondary | ICD-10-CM

## 2021-03-20 DIAGNOSIS — E785 Hyperlipidemia, unspecified: Secondary | ICD-10-CM

## 2021-03-20 LAB — COMPLETE METABOLIC PANEL WITH GFR
AG Ratio: 1.5 (calc) (ref 1.0–2.5)
ALT: 12 U/L (ref 6–29)
AST: 17 U/L (ref 10–35)
Albumin: 4.2 g/dL (ref 3.6–5.1)
Alkaline phosphatase (APISO): 69 U/L (ref 37–153)
BUN: 10 mg/dL (ref 7–25)
CO2: 26 mmol/L (ref 20–32)
Calcium: 9.4 mg/dL (ref 8.6–10.4)
Chloride: 104 mmol/L (ref 98–110)
Creat: 0.58 mg/dL (ref 0.50–1.05)
Globulin: 2.8 g/dL (calc) (ref 1.9–3.7)
Glucose, Bld: 139 mg/dL — ABNORMAL HIGH (ref 65–99)
Potassium: 3.9 mmol/L (ref 3.5–5.3)
Sodium: 140 mmol/L (ref 135–146)
Total Bilirubin: 0.4 mg/dL (ref 0.2–1.2)
Total Protein: 7 g/dL (ref 6.1–8.1)
eGFR: 99 mL/min/{1.73_m2} (ref >=60)

## 2021-03-20 LAB — CBC
HCT: 38.3 % (ref 35.0–45.0)
Hemoglobin: 12.5 g/dL (ref 11.7–15.5)
MCH: 29.2 pg (ref 27.0–33.0)
MCHC: 32.6 g/dL (ref 32.0–36.0)
MCV: 89.5 fL (ref 80.0–100.0)
MPV: 10.6 fL (ref 7.5–12.5)
Platelets: 275 10*3/uL (ref 140–400)
RBC: 4.28 10*6/uL (ref 3.80–5.10)
RDW: 12.7 % (ref 11.0–15.0)
WBC: 4.6 10*3/uL (ref 3.8–10.8)

## 2021-03-20 LAB — POCT GLYCOSYLATED HEMOGLOBIN (HGB A1C): Hemoglobin A1C: 6.8 % — AB (ref 4.0–5.6)

## 2021-03-20 LAB — LIPID PANEL W/REFLEX DIRECT LDL
Cholesterol: 160 mg/dL (ref <200)
HDL: 64 mg/dL (ref >=50)
LDL Cholesterol (Calc): 78 mg/dL (calc)
Non-HDL Cholesterol (Calc): 96 mg/dL (calc) (ref <130)
Total CHOL/HDL Ratio: 2.5 (calc) (ref <5.0)
Triglycerides: 96 mg/dL (ref <150)

## 2021-03-20 LAB — IRON,TIBC AND FERRITIN PANEL
%SAT: 14 % (calc) — ABNORMAL LOW (ref 16–45)
Ferritin: 15 ng/mL — ABNORMAL LOW (ref 16–288)
Iron: 56 ug/dL (ref 45–160)
TIBC: 407 mcg/dL (calc) (ref 250–450)

## 2021-03-20 LAB — TSH: TSH: 1.7 mIU/L (ref 0.40–4.50)

## 2021-03-20 NOTE — Progress Notes (Signed)
Subjective:    Patient ID: Connie Cardenas, female    DOB: Mar 23, 1951, 70 y.o.   MRN: [id number]  HPI 70 y.o female presenting for her 3 month follow-up with a history of T2DM, HTN, hyperlipidemia, and carotid artery stenosis. Pt states her morning blood sugars have been averaging around 130 mg/dL. A1C at today's visit decreased to 6.8% with the addition of Rybelsus to her Metformin and Actos regimen. However, pt reports being out of Rybelsus since late October due to coverage issues. Pt denies hypoglycemic events, vision changes, weakness, or speech changes at this time. She does report LE vein swelling and a foot callus. Reports neither are painful or tender to palpation.    .. Active Ambulatory Problems    Diagnosis Date Noted   Dyslipidemia, goal LDL below 70 10/04/2015   Diabetes mellitus (HCC) 10/04/2015   Essential hypertension, benign 10/04/2015   Obesity 01/04/2016   Hyperlipidemia associated with type 2 diabetes mellitus (HCC) 04/14/2016   Elevated liver enzymes 07/15/2017   Systolic ejection murmur 07/15/2017   Left carotid bruit 07/15/2017   Liver cyst 04/08/2018   Breast cyst, left 04/11/2018   Moderate aortic valve stenosis 04/11/2018   Bicuspid aortic valve 04/11/2018   Carotid artery stenosis, asymptomatic, right 04/11/2018   Swelling of left index finger 06/11/2018   History of COVID-19 05/05/2019   Chronic right shoulder pain 08/03/2019   IDA (iron deficiency anemia) 11/13/2019   Type II diabetes mellitus with complication, uncontrolled 06/01/2020   Low serum iron 03/20/2021   Asymptomatic varicose veins of right lower extremity 03/20/2021   Foot callus 03/20/2021   Resolved Ambulatory Problems    Diagnosis Date Noted   COVID-19 04/22/2019   Past Medical History:  Diagnosis Date   Diabetes mellitus without complication (HCC)    Hypertension      Review of Systems See HPI.     Objective:   Physical Exam Vitals reviewed.  Constitutional:       Appearance: Normal appearance. She is obese.  HENT:     Head: Normocephalic.  Neck:     Vascular: No carotid bruit.  Cardiovascular:     Rate and Rhythm: Normal rate and regular rhythm.     Pulses: Normal pulses.     Heart sounds: Murmur heard.     Comments: 4/6 SEM radiating into right carotid.  Pulmonary:     Effort: Pulmonary effort is normal.     Breath sounds: Normal breath sounds.  Musculoskeletal:     Right lower leg: No edema.     Left lower leg: No edema.  Skin:    Comments: Varicose veins worse right than left.   Neurological:     General: No focal deficit present.     Mental Status: [name], place, and time.  Psychiatric:        Mood and Affect: Mood normal.          Assessment & Plan:  Connie Cardenas was seen today for diabetes, hypertension and hyperlipidemia.  Diagnoses and all orders for this visit:  Type 2 diabetes mellitus with other specified complication, without long-term current use of insulin (HCC) -     POCT glycosylated hemoglobin (Hb A1C) - metFORMIN (GLUCOPHAGE) 1000 MG tablet; Take 1 tablet (1,000 mg total) by mouth 2 (two) times daily with a meal. -  pioglitazone (ACTOS) 45 MG tablet; Take 1 tablet (45 mg total) by mouth daily. - Pt to connect with pharmacist to work on getting  her back on Rybelsus 3mg    Essential hypertension, benign - Lisinopril (ZESTRIL) 5 MG tablet; Take 1 tablet by mouth daily   Hyperlipidemia associated with type 2 diabetes mellitus (HCC) -   rosuvastatin (CRESTOR) 10 MG tablet; Take 1 tablet (10 mg total) by mouth daily  Moderate aortic valve stenosis  Varicose veins of lower extremity, unspecified laterality, unspecified whether complicated  -Recommend pt begin wearing 15-30 mmHg compression socks as tolerated   -A1C down to 6.8% today from 8.2 at last visit.  -Pt to connect with pharmacist to get approval for Rybelsus -BP elevated at today's visit but pt reports she did not take  Lisinopril this morning  -Foot and eye exam UTD -Discussed starting compression socks for varicose veins -declines covid vaccine -declines flu and pneumonia shot.  -Follow-up in 3 months

## 2021-03-20 NOTE — Patient Instructions (Signed)
37mm to 55mm of pressure for compression stockings  Varicose Veins Varicose veins are veins that have become enlarged, bulged, and twisted. They most often appear in the legs. What are the causes? This condition is caused by damage to the valves in the vein. These valves help blood return to your heart. When they are damaged and they stop working properly, blood may flow backward and back up in the veins near the skin, causing the veins to get larger and appear twisted. The condition can result from any issue that causes blood to back up, like pregnancy, prolonged standing, or obesity. What increases the risk? The following factors may make you more likely to develop this condition: Being on your feet a lot. Being pregnant. Being overweight. Smoking. Having had a previous deep vein thrombosis or having a thrombotic disorder. Aging. The risk increases with age. Having a condition called Klippel-Trenaunay syndrome. What are the signs or symptoms? Symptoms of this condition include: Bulging, twisted, and bluish veins. A feeling of heaviness in your legs. This may be worse at the end of the day. Leg pain. This may be worse at the end of the day. Swelling in the leg. Changes in skin color over the veins. Swelling or pain in the legs can limit your activities. Your symptoms may get worse when you sit or stand for long periods of time. How is this diagnosed? This condition may be diagnosed based on: Your symptoms, family history, activity levels, and lifestyle. A physical exam. You may also have tests, including an ultrasound or X-ray. How is this treated? Treatment for this condition may involve: Avoiding sitting or standing in one position for long periods of time. Wearing compression stockings. These stockings help to prevent blood clots and reduce swelling in the legs. Raising (elevating) the legs when resting. Losing weight. Exercising regularly. If you have persistent symptoms or  want to improve the way your varicose veins look, you may choose to have a procedure to close the varicose veins off or to remove them. Nonsurgical treatments to close off the veins include: Sclerotherapy. In this treatment, a solution is injected into a vein to close it off. Laser treatment. The vein is heated with a laser to close it off. Radiofrequency vein ablation. An electrical current produced by radio waves is used to close off the vein. Surgical treatments to remove the veins include: Phlebectomy. In this procedure, the veins are removed through small incisions made over the veins. Vein ligation and stripping. In this procedure, incisions are made over the veins. The veins are then removed after being tied (ligated) with stitches (sutures). Follow these instructions at home: Medicines Take over-the-counter and prescription medicines only as told by your health care provider. If you were prescribed an antibiotic medicine, use it as told by your health care provider. Do not stop using the antibiotic even if you start to feel better. Activity Walk as much as possible. Walking increases blood flow. This helps blood return to the heart and takes pressure off your veins. Do not stand or sit in one position for a long period of time. Do not sit with your legs crossed. Avoid sitting for a long time without moving. Get up to take short walks every 1-2 hours. This is important to improve blood flow and breathing. Ask for help if you feel weak or unsteady. Return to your normal activities as told by your health care provider. Ask your health care provider what activities are safe for you. Do exercises  as told by your health care provider. General instructions  Follow any diet instructions given to you by your health care provider. Elevate your legs at night to above the level of your heart. If you get a cut in the skin over the varicose vein and the vein bleeds: Lie down with your leg  raised. Apply firm pressure to the cut with a clean cloth until the bleeding stops. Place a bandage (dressing) on the cut. Drink enough fluid to keep your urine pale yellow. Do not use any products that contain nicotine or tobacco. These products include cigarettes, chewing tobacco, and vaping devices, such as e-cigarettes. If you need help quitting, ask your health care provider. Wear compression stockings as told by your health care provider. Do not wear other kinds of tight clothing around your legs, pelvis, or waist. Keep all follow-up visits. This is important. Contact a health care provider if: The skin around your varicose veins starts to break down. You have more pain, redness, tenderness, or hard swelling over a vein. You are uncomfortable because of pain. You get a cut in the skin over a varicose vein and it will not stop bleeding. Get help right away if: You have chest pain. You have trouble breathing. You have severe leg pain. Summary Varicose veins are veins that have become enlarged, bulged, and twisted. They most often appear in the legs. This condition is caused by damage to the valves in the vein. These valves help blood return to your heart. Treatment for this condition includes frequent movements, wearing compression stockings, losing weight, and exercising regularly. In some cases, procedures are done to close off or remove the veins. Nonsurgical treatments to close off the veins include sclerotherapy, laser therapy, and radiofrequency vein ablation. This information is not intended to replace advice given to you by your health care provider. Make sure you discuss any questions you have with your health care provider. Document Revised: 09/21/2020 Document Reviewed: 09/21/2020 Elsevier Patient Education  2022 [employer].

## 2021-03-21 NOTE — Progress Notes (Signed)
Connie Cardenas,   LDL right at goal under 70. Your LDL is 78. Have you been taking crestor daily?  Kidney, liver looks great.  Thyroid looks great.  Serum iron looks good.  Iron stores are better than normal but still a little low. How much iron are you taking daily?

## 2021-03-22 ENCOUNTER — Telehealth: Payer: Self-pay | Admitting: Physician Assistant

## 2021-03-22 DIAGNOSIS — I10 Essential (primary) hypertension: Secondary | ICD-10-CM | POA: Diagnosis not present

## 2021-03-22 DIAGNOSIS — E785 Hyperlipidemia, unspecified: Secondary | ICD-10-CM | POA: Diagnosis not present

## 2021-03-22 DIAGNOSIS — E1169 Type 2 diabetes mellitus with other specified complication: Secondary | ICD-10-CM

## 2021-03-22 NOTE — Telephone Encounter (Signed)
Pt had not been able to get rybelsus? Are you following up with her? She never titrated past 3mg  and she was doing good and her last a1c was still under 7. I saw her this week and did not know where to send rx if I needed to.   .. Lab Results  Component Value Date   HGBA1C 6.8 (A) 03/20/2021

## 2021-03-30 ENCOUNTER — Telehealth: Payer: Self-pay

## 2021-03-30 NOTE — Telephone Encounter (Signed)
Medication: Semaglutide (RYBELSUS) 14 MG TABS Prior authorization submitted via CoverMyMeds on 03/30/2021 [place] submission pending

## 2021-03-31 ENCOUNTER — Other Ambulatory Visit: Payer: Self-pay

## 2021-03-31 ENCOUNTER — Ambulatory Visit: Payer: Medicare Other | Admitting: Pharmacist

## 2021-03-31 DIAGNOSIS — I10 Essential (primary) hypertension: Secondary | ICD-10-CM

## 2021-03-31 DIAGNOSIS — E1169 Type 2 diabetes mellitus with other specified complication: Secondary | ICD-10-CM

## 2021-03-31 NOTE — Progress Notes (Signed)
Chronic Care Management Pharmacy Note  03/31/2021 Name:  Gayleen Sholtz MRN:  [id number] DOB:  11-Mar-1951  Summary: addressed DM, HTN, HLD. A1c improved, ran out of rybelsus 44m while on a [place] for extended time period. Discussed that more walking, drinking water, and dietary changes may have contributed to this, but overall a great thing!   Recommendations/Changes made from today's visit:  - encouraged patient pick up rybelsus 3281msample box, as well as go to pharmacy with coupon and fill 81m15mND 54m63mescriptions prior to coupon expiring April 22, 2021.  Plan: f/u with pharmacist in 1 month  Subjective: PatrEmmah Bratcheran 70 y47. year old female who is a primary patient of BreeDonella Stade-C.  The CCM team was consulted for assistance with disease management and care coordination needs.    Engaged with patient by telephone for follow up visit in response to provider referral for pharmacy case management and/or care coordination services.   Consent to Services:  The patient was given information about Chronic Care Management services, agreed to services, and gave verbal consent prior to initiation of services.  Please see initial visit note for detailed documentation.   Patient Care Team: BreeLavada MesiPCP - General (Family Medicine)   Objective:  Lab Results  Component Value Date   CREATININE 0.58 03/20/2021   CREATININE 0.66 06/03/2020   CREATININE 0.75 11/11/2019    Lab Results  Component Value Date   HGBA1C 6.8 (A) 03/20/2021   Last diabetic Eye exam:  Lab Results  Component Value Date/Time   HMDIABEYEEXA No Retinopathy 10/30/2019 12:00 AM        Component Value Date/Time   CHOL 160 03/20/2021 1040   TRIG 96 03/20/2021 1040   HDL 64 03/20/2021 1040   CHOLHDL 2.5 03/20/2021 1040   LDLCALC 78 03/20/2021 1040    Hepatic Function Latest Ref Rng & Units 03/20/2021 06/03/2020 11/11/2019  Total Protein 6.1 - 8.1 g/dL  7.0 6.5 6.3  Albumin 3.6 - 5.1 g/dL - - -  AST 10 - 35 U/L _0 ALT 6 - 29 U/L _1 Alk Phosphatase 33 - 130 U/L - - -  Total Bilirubin 0.2 - 1.2 mg/dL 0.4 0.4 0.4    CBC Latest Ref Rng & Units 03/20/2021 02/10/2020 11/11/2019  WBC 3.8 - 10.8 Thousand/uL 4.6 4.6 5.0  Hemoglobin 11.7 - 15.5 g/dL 12.5 11.7 11.0(L)  Hematocrit 35.0 - 45.0 % 38.3 35.7 33.6(L)  Platelets 140 - 400 Thousand/uL 275 278 256    Clinical ASCVD: Yes  The 10-year ASCVD risk score (Arnett DK, et al., 2019) is: 19.9%   Values used to calculate the score:     Age: 70 y13rs     Sex: Female     Is Non-Hispanic African American: No     Diabetic: Yes     Tobacco smoker: No     Systolic Blood Pressure: 150 038g     Is BP treated: Yes     HDL Cholesterol: 64 mg/dL     Total Cholesterol: 160 mg/dL    Other: (CHADS2VASc if Afib, PHQ9 if depression, MMRC or CAT for COPD, ACT, DEXA)  Social History   Tobacco Use  Smoking Status Never  Smokeless Tobacco Never   BP Readings from Last 3 Encounters:  03/20/21 (!) 150/63  08/31/20 (!) 143/63  06/01/20 128/78   Pulse Readings from Last 3 Encounters:  03/20/21 77  08/31/20  80  06/01/20 82   Wt Readings from Last 3 Encounters:  03/20/21 192 lb (87.1 kg)  08/31/20 194 lb (88 kg)  06/01/20 196 lb (88.9 kg)    Assessment: Review of patient past medical history, allergies, medications, health status, including review of consultants reports, laboratory and other test data, was performed as part of comprehensive evaluation and provision of chronic care management services.   SDOH:  (Social Determinants of Health) assessments and interventions performed:    CCM Care Plan  Allergies  Allergen Reactions   Farxiga [Dapagliflozin]     Recurrent yeast infections.    Penicillins     Medications Reviewed Today     Reviewed by Darius Bump, [facility] (Pharmacist) on 03/31/21 at 1017  Med List Status: <None>   Medication Order Taking? Sig Documenting  Provider Last Dose Status Informant  AMBULATORY NON FORMULARY MEDICATION [id number] Yes Glucometer lancets and test strips for DM to check 1-2 times a day. Donella Stade, PA-C Taking Active   Ascorbic Acid (VITAMIN C PO) [id number] Yes Take by mouth. [provider] Taking Active   aspirin 81 MG tablet [id number] Yes Take 81 mg by mouth daily. [provider] Taking Active   Cholecalciferol (VITAMIN D PO) [id number] Yes Take by mouth. [provider] Taking Active   Ferrous Sulfate (IRON PO) [id number] Yes Take 1 tablet by mouth 3 (three) times a week. [provider] Taking Active   lisinopril (ZESTRIL) 5 MG tablet [id number] Yes Take 1 tablet (5 mg total) by mouth daily. Donella Stade, PA-C Taking Active   metFORMIN (GLUCOPHAGE) 1000 MG tablet [id number] Yes Take 1 tablet (1,000 mg total) by mouth 2 (two) times daily with a meal. [name], [name], PA-C Taking Active   pioglitazone (ACTOS) 45 MG tablet [id number] Yes Take 1 tablet (45 mg total) by mouth daily. Donella Stade, PA-C Taking Active   rosuvastatin (CRESTOR) 20 MG tablet [id number] Yes Take 1 tablet (20 mg total) by mouth daily.  Patient taking differently: Take 20 mg by mouth every other day.   [name], [name], PA-C Taking Active   Semaglutide (RYBELSUS) 14 MG TABS [id number] No Take 14 mg by mouth daily.  Patient not taking: Reported on 03/31/2021   Lavada Mesi Not Taking Active             Patient Active Problem List   Diagnosis Date Noted   Low serum iron 03/20/2021   Asymptomatic varicose veins of right lower extremity 03/20/2021   Foot callus 03/20/2021   Type II diabetes mellitus with complication, uncontrolled 06/01/2020   IDA (iron deficiency anemia) 11/13/2019   Chronic right shoulder pain 08/03/2019   History of COVID-19 05/05/2019   Swelling of left index finger 06/11/2018   Breast cyst, left 04/11/2018   Moderate aortic valve stenosis 04/11/2018   Bicuspid aortic  valve 04/11/2018   Carotid artery stenosis, asymptomatic, right 04/11/2018   Liver cyst 04/08/2018   Elevated liver enzymes 48/54/6270   Systolic ejection murmur 35/00/9381   Left carotid bruit 07/15/2017   Hyperlipidemia associated with type 2 diabetes mellitus ([place]) 04/14/2016   Obesity 01/04/2016   Dyslipidemia, goal LDL below 70 10/04/2015   Diabetes mellitus ([place]) 10/04/2015   Essential hypertension, benign 10/04/2015     There is no immunization history on file for this patient.  Conditions to be addressed/monitored: HTN, HLD, and DMII  Care Plan : Medication Management  Updates made by Darius Bump, [place] since 03/31/2021 12:00  AM     Problem: DM, HTN, HLD      Long-Range Goal: Disease Progression Prevention   Start Date: 09/15/2020  Recent Progress: On track  Priority: High  Note:   Current Barriers:  Unable to achieve control of diabetes  Suboptimal therapeutic regimen for hyperlipidemia/ASCVD risk management  Pharmacist Clinical Goal(s):  Over the next 30 days, patient will adhere to plan to optimize therapeutic regimen for diabetes and hyperlipidemia as evidenced by report of adherence to recommended medication management changes through collaboration with PharmD and provider.   Interventions: 1:1 collaboration with Donella Stade, PA-C regarding development and update of comprehensive plan of care as evidenced by provider attestation and co-signature Inter-disciplinary care team collaboration (see longitudinal plan of care) Comprehensive medication review performed; medication list updated in electronic medical record  Diabetes:  Uncontrolled; current treatment: metformin 1g BID, pioglitazone 18m daily;   Current glucose readings: fasting glucose: 128, 130s, post prandial glucose: not checking  Denies hypoglycemic/hyperglycemic symptoms  Previously discussed meal patterns: breakfast: coffee (no appetite), sometimes eggs w/cheese; lunch: cottage cheese  w/fruit, sometimes sandwich w/low-cal bread ; dinner: soup, meat +sides, mindful of potatoes/pasta; snacks: cheese + crackers, granola bar (watches sugar); drinks: water, 1/2 sweet/1/2 unsweet ice tea,   Current exercise: attempts walking, watches young grandkids  Counseled on realistic diet modification/choices/moderation, medication side effects & mechanism of action, and plan for titration of rybelsus dose. Recommended continue current medications, patient to pick up rybelsus 36msample and coupon for 40m26mnd 66m26m use at her pharmacy PRIOR to expiring dec [phone] initiate patient assistance for 2023 for rybelsus at future visits.   Hypertension:  Controlled; current treatment: lisinopril 5mg 35mly;   Current home readings: not checking  Denies hypotensive/hypertensive symptoms  Recommended checking BP once per week, writing down numbers for review at next appointment and continue current regimen  Hyperlipidemia:  Controlled; current treatment: rosuvastatin 20mg 56m of the 10mg t73mts);   Medications previously tried: N/A  Recommended continue rosuvastatin 20mg as50merated (regardless of LDL) due to age <75, hx <28 and atherosclerotic etiology (carotid stenosis)    Patient Goals/Self-Care Activities Over the next 30 days, patient will:  take medications as prescribed and check glucose 2x per day (AM/fasting, and ~2hr after a meal), document, and provide at future appointments  Follow Up Plan: Telephone follow up appointment with care management team member scheduled for:  1 month        Medication Assistance: Application for rybelsus (novonordisk)  medication assistance program. in process.  Anticipated assistance start date 11/04/20.  See plan of care for additional detail.  [id number] [id number] [address] [place] [phone] Up:  Patient agrees to Care Plan and Follow-up.  Plan: Telephone follow up appointment with care management team member scheduled for:  1 month   Jermayne Sweeney KLarinda Buttery Clinical Pharmacist [facility]-[phone]

## 2021-03-31 NOTE — Patient Instructions (Signed)
Visit Information  Hi Ms Pat - I called [facility] and they have both the 7mg  prescription and 14mg  prescription available for you. The sample box of 3mg  AND the coupon are at our front office, ready for you to pick up. Take the coupon to the pharmacy to fill the 7mg  and 14mg  before it expires! Let me know if you have any questions or run into any trouble!   Thank you for taking time to visit with me today. Please don't hesitate to contact me if I can be of assistance to you before our next scheduled telephone appointment.  Following are the goals we discussed today:   Patient Goals/Self-Care Activities Over the next 30 days, patient will:  take medications as prescribed and check glucose 2x per day (AM/fasting, and ~2hr after a meal), document, and provide at future appointments  Follow Up Plan: Telephone follow up appointment with care management team member scheduled for:  1 month   Please call the care guide team at [phone] if you need to cancel or reschedule your appointment.    Patient verbalizes understanding of instructions provided today and agrees to view in MyChart.   

## 2021-04-04 ENCOUNTER — Encounter: Payer: Self-pay | Admitting: Physician Assistant

## 2021-05-08 ENCOUNTER — Other Ambulatory Visit: Payer: Self-pay

## 2021-05-08 ENCOUNTER — Ambulatory Visit (INDEPENDENT_AMBULATORY_CARE_PROVIDER_SITE_OTHER): Payer: Medicare Other | Admitting: Pharmacist

## 2021-05-08 DIAGNOSIS — E1169 Type 2 diabetes mellitus with other specified complication: Secondary | ICD-10-CM

## 2021-05-08 DIAGNOSIS — I10 Essential (primary) hypertension: Secondary | ICD-10-CM

## 2021-05-08 DIAGNOSIS — E785 Hyperlipidemia, unspecified: Secondary | ICD-10-CM

## 2021-05-08 NOTE — Progress Notes (Signed)
Chronic Care Management Pharmacy Note  05/09/2021 Name:  Connie Cardenas MRN:  [id number] DOB:  1950/09/24  Summary: addressed DM, HTN, HLD. Patient is on rybelsus 33m daily using office sample, however coupon for 769mand 147mas not successful at pharmacy. She will go to [place] [facility] weeks at the end of March.  BG:145 this morning.   Recommendations/Changes made from today's visit:  Facilitated 2023 rybelsus patient assistance, application forms ready for pickup at office, patient can also pick up another 3mg86mbelsus sample for continuing therapy while we wait on application processing.  Plan: f/u with pharmacist in 1 month  Subjective: PatrFoster Sonnieran 70 y74. year old female who is a primary patient of BreeDonella Stade-C.  The CCM team was consulted for assistance with disease management and care coordination needs.    Engaged with patient by telephone for follow up visit in response to provider referral for pharmacy case management and/or care coordination services.   Consent to Services:  The patient was given information about Chronic Care Management services, agreed to services, and gave verbal consent prior to initiation of services.  Please see initial visit note for detailed documentation.   Patient Care Team: BreeLavada MesiPCP - General (Family Medicine)   Objective:  Lab Results  Component Value Date   CREATININE 0.58 03/20/2021   CREATININE 0.66 06/03/2020   CREATININE 0.75 11/11/2019    Lab Results  Component Value Date   HGBA1C 6.8 (A) 03/20/2021   Last diabetic Eye exam:  Lab Results  Component Value Date/Time   HMDIABEYEEXA No Retinopathy 10/30/2019 12:00 AM        Component Value Date/Time   CHOL 160 03/20/2021 1040   TRIG 96 03/20/2021 1040   HDL 64 03/20/2021 1040   CHOLHDL 2.5 03/20/2021 1040   LDLCALC 78 03/20/2021 1040    Hepatic Function Latest Ref Rng & Units 03/20/2021 06/03/2020 11/11/2019   Total Protein 6.1 - 8.1 g/dL 7.0 6.5 6.3  Albumin 3.6 - 5.1 g/dL - - -  AST 10 - 35 U/L '17 18 18  ' ALT 6 - 29 U/L '12 15 15  ' Alk Phosphatase 33 - 130 U/L - - -  Total Bilirubin 0.2 - 1.2 mg/dL 0.4 0.4 0.4    CBC Latest Ref Rng & Units 03/20/2021 02/10/2020 11/11/2019  WBC 3.8 - 10.8 Thousand/uL 4.6 4.6 5.0  Hemoglobin 11.7 - 15.5 g/dL 12.5 11.7 11.0(L)  Hematocrit 35.0 - 45.0 % 38.3 35.7 33.6(L)  Platelets 140 - 400 Thousand/uL 275 278 256    Clinical ASCVD: Yes  The 10-year ASCVD risk score (Arnett DK, et al., 2019) is: 22.2%   Values used to calculate the score:     Age: 38 y41rs     Sex: Female     Is Non-Hispanic African American: No     Diabetic: Yes     Tobacco smoker: No     Systolic Blood Pressure: 150 242g     Is BP treated: Yes     HDL Cholesterol: 64 mg/dL     Total Cholesterol: 160 mg/dL    Social History   Tobacco Use  Smoking Status Never  Smokeless Tobacco Never   BP Readings from Last 3 Encounters:  03/20/21 (!) 150/63  08/31/20 (!) 143/63  06/01/20 128/78   Pulse Readings from Last 3 Encounters:  03/20/21 77  08/31/20 80  06/01/20 82   Wt Readings from Last 3 Encounters:  03/20/21 192 lb (87.1 kg)  08/31/20 194 lb (88 kg)  06/01/20 196 lb (88.9 kg)    Assessment: Review of patient past medical history, allergies, medications, health status, including review of consultants reports, laboratory and other test data, was performed as part of comprehensive evaluation and provision of chronic care management services.   SDOH:  (Social Determinants of Health) assessments and interventions performed:    CCM Care Plan  Allergies  Allergen Reactions   Farxiga [Dapagliflozin]     Recurrent yeast infections.    Penicillins     Medications Reviewed Today     Reviewed by Darius Bump, [facility] (Pharmacist) on 03/31/21 at 1017  Med List Status: <None>   Medication Order Taking? Sig Documenting Provider Last Dose Status Informant  AMBULATORY NON  FORMULARY MEDICATION [id number] Yes Glucometer lancets and test strips for DM to check 1-2 times a day. Donella Stade, PA-C Taking Active   Ascorbic Acid (VITAMIN C PO) [id number] Yes Take by mouth. [provider] Taking Active   aspirin 81 MG tablet [id number] Yes Take 81 mg by mouth daily. [provider] Taking Active   Cholecalciferol (VITAMIN D PO) [id number] Yes Take by mouth. [provider] Taking Active   Ferrous Sulfate (IRON PO) [id number] Yes Take 1 tablet by mouth 3 (three) times a week. [provider] Taking Active   lisinopril (ZESTRIL) 5 MG tablet [id number] Yes Take 1 tablet (5 mg total) by mouth daily. Donella Stade, PA-C Taking Active   metFORMIN (GLUCOPHAGE) 1000 MG tablet [id number] Yes Take 1 tablet (1,000 mg total) by mouth 2 (two) times daily with a meal. [name], [name], PA-C Taking Active   pioglitazone (ACTOS) 45 MG tablet [id number] Yes Take 1 tablet (45 mg total) by mouth daily. Donella Stade, PA-C Taking Active   rosuvastatin (CRESTOR) 20 MG tablet [id number] Yes Take 1 tablet (20 mg total) by mouth daily.  Patient taking differently: Take 20 mg by mouth every other day.   [name], [name], PA-C Taking Active   Semaglutide (RYBELSUS) 14 MG TABS [id number] No Take 14 mg by mouth daily.  Patient not taking: Reported on 03/31/2021   Lavada Mesi Not Taking Active             Patient Active Problem List   Diagnosis Date Noted   Low serum iron 03/20/2021   Asymptomatic varicose veins of right lower extremity 03/20/2021   Foot callus 03/20/2021   Type II diabetes mellitus with complication, uncontrolled 06/01/2020   IDA (iron deficiency anemia) 11/13/2019   Chronic right shoulder pain 08/03/2019   History of COVID-19 05/05/2019   Swelling of left index finger 06/11/2018   Breast cyst, left 04/11/2018   Moderate aortic valve stenosis 04/11/2018   Bicuspid aortic valve 04/11/2018   Carotid artery stenosis,  asymptomatic, right 04/11/2018   Liver cyst 04/08/2018   Elevated liver enzymes 63/89/3734   Systolic ejection murmur 28/76/8115   Left carotid bruit 07/15/2017   Hyperlipidemia associated with type 2 diabetes mellitus ([place]) 04/14/2016   Obesity 01/04/2016   Dyslipidemia, goal LDL below 70 10/04/2015   Diabetes mellitus ([place]) 10/04/2015   Essential hypertension, benign 10/04/2015     There is no immunization history on file for this patient.  Conditions to be addressed/monitored: HTN, HLD, and DMII  Care Plan : Medication Management  Updates made by Darius Bump, [place] since 05/09/2021 12:00 AM     Problem: DM, HTN, HLD  Long-Range Goal: Disease Progression Prevention   Start Date: 09/15/2020  Recent Progress: On track  Priority: High  Note:   Current Barriers:  Unable to achieve control of diabetes  Suboptimal therapeutic regimen for hyperlipidemia/ASCVD risk management  Pharmacist Clinical Goal(s):  Over the next 30 days, patient will adhere to plan to optimize therapeutic regimen for diabetes and hyperlipidemia as evidenced by report of adherence to recommended medication management changes through collaboration with PharmD and provider.   Interventions: 1:1 collaboration with Donella Stade, PA-C regarding development and update of comprehensive plan of care as evidenced by provider attestation and co-signature Inter-disciplinary care team collaboration (see longitudinal plan of care) Comprehensive medication review performed; medication list updated in electronic medical record  Diabetes:  Uncontrolled; current treatment: metformin 1g BID, pioglitazone 31m daily, rybelsus 325mdaily;   Current glucose readings: fasting glucose: 128, 130s, post prandial glucose: not checking  Denies hypoglycemic/hyperglycemic symptoms  Previously discussed meal patterns: breakfast: coffee (no appetite), sometimes eggs w/cheese; lunch: cottage cheese w/fruit, sometimes sandwich  w/low-cal bread ; dinner: soup, meat +sides, mindful of potatoes/pasta; snacks: cheese + crackers, granola bar (watches sugar); drinks: water, 1/2 sweet/1/2 unsweet ice tea,   Current exercise: attempts walking, watches young grandkids  Counseled on realistic diet modification/choices/moderation, medication side effects & mechanism of action, and plan for titration of rybelsus dose. Recommended continue current medications, patient to pick up rybelsus 74m4mample and application for [id number] assistance at front office.  Hypertension:  Controlled; current treatment: lisinopril 5mg37mily;   Current home readings: not checking  Denies hypotensive/hypertensive symptoms  Recommended checking BP once per week, writing down numbers for review at next appointment and continue current regimen  Hyperlipidemia:  Controlled; current treatment: rosuvastatin 20mg61mo of the 10mg 81mets);   Medications previously tried: N/A  Recommended continue rosuvastatin 20mg a74mlerated (regardless of LDL) due to age <75, hx<70, and atherosclerotic etiology (carotid stenosis)    Patient Goals/Self-Care Activities Over the next 30 days, patient will:  take medications as prescribed and check glucose 2x per day (AM/fasting, and ~2hr after a meal), document, and provide at future appointments  Follow Up Plan: Telephone follow up appointment with care management team member scheduled for:  1 month         Medication Assistance: Application for rybelsus (novonordisk)  medication assistance program. in process.  Anticipated assistance start date 11/04/20.  See plan of care for additional detail.  [id number] [id number] [address] [place] [phone] Up:  Patient agrees to Care Plan and Follow-up.  Plan: Telephone follow up appointment with  care management team member scheduled for:  1 month   Rabab Currington Larinda ButteryD Clinical Pharmacist [phone]

## 2021-05-09 NOTE — Patient Instructions (Signed)
Visit Information  Thank you for taking time to visit with me today. Please don't hesitate to contact me if I can be of assistance to you before our next scheduled telephone appointment.  Following are the goals we discussed today:  Patient Goals/Self-Care Activities Over the next 30 days, patient will:  take medications as prescribed and check glucose 2x per day (AM/fasting, and ~2hr after a meal), document, and provide at future appointments  Follow Up Plan: Telephone follow up appointment with care management team member scheduled for:  1 month   Please call the care guide team at [phone] if you need to cancel or reschedule your appointment.   Patient verbalizes understanding of instructions and care plan provided today and agrees to view in [place]. Active MyChart status confirmed with patient.    Connie Cardenas

## 2021-05-19 ENCOUNTER — Encounter: Payer: Self-pay | Admitting: Physician Assistant

## 2021-05-23 DIAGNOSIS — Z7984 Long term (current) use of oral hypoglycemic drugs: Secondary | ICD-10-CM | POA: Diagnosis not present

## 2021-05-23 DIAGNOSIS — I10 Essential (primary) hypertension: Secondary | ICD-10-CM | POA: Diagnosis not present

## 2021-05-23 DIAGNOSIS — E785 Hyperlipidemia, unspecified: Secondary | ICD-10-CM

## 2021-05-23 DIAGNOSIS — E1159 Type 2 diabetes mellitus with other circulatory complications: Secondary | ICD-10-CM

## 2021-05-26 NOTE — Telephone Encounter (Signed)
Thanks

## 2021-05-30 ENCOUNTER — Ambulatory Visit (INDEPENDENT_AMBULATORY_CARE_PROVIDER_SITE_OTHER): Payer: Medicare Other | Admitting: Pharmacist

## 2021-05-30 ENCOUNTER — Other Ambulatory Visit: Payer: Self-pay

## 2021-05-30 DIAGNOSIS — I10 Essential (primary) hypertension: Secondary | ICD-10-CM

## 2021-05-30 DIAGNOSIS — E1169 Type 2 diabetes mellitus with other specified complication: Secondary | ICD-10-CM

## 2021-05-30 DIAGNOSIS — E785 Hyperlipidemia, unspecified: Secondary | ICD-10-CM

## 2021-05-30 NOTE — Progress Notes (Signed)
Chronic Care Management Pharmacy Note  05/30/2021 Name:  Connie Cardenas MRN:  [id number] DOB:  December 23, 1950  Summary: addressed DM, HTN, HLD. Patient is on rybelsus 59m daily using office sample, but patient does not wish to pursue patient assistance via novonordisk due to application fine print. [place]  Answered chromium, magnesium questions around diabetes control and safety in supplementation. Will provide literature review in patient AVS/instructions.  Patient describes home BG 125-140s, highest 170.   Recommendations/Changes made from today's visit:  No changes, continue current medications & lifestyle modifications. Patient wishes to continue pursuing other methods to obtain rybelsus (either overseas or via financial help from family). She will be eligible to increase to rybelsus 780mdaily whenever she is able to obtain medication.  Plan: f/u with pharmacist in 4-5 months  Subjective: [name] an [age].o. year old female who is a primary patient of BrDonella StadePA-C.  The CCM team was consulted for assistance with disease management and care coordination needs.    Engaged with patient by telephone for follow up visit in response to provider referral for pharmacy case management and/or care coordination services.   Consent to Services:  The patient was given information about Chronic Care Management services, agreed to services, and gave verbal consent prior to initiation of services.  Please see initial visit note for detailed documentation.   Patient Care Team: BrLavada Mesis PCP - General (Family Medicine)   Objective:  Lab Results  Component Value Date   CREATININE 0.58 03/20/2021   CREATININE 0.66 06/03/2020   CREATININE 0.75 11/11/2019    Lab Results  Component Value Date   HGBA1C 6.8 (A) 03/20/2021   Last diabetic Eye exam:  Lab Results  Component Value Date/Time    HMDIABEYEEXA No Retinopathy 10/30/2019 12:00 AM        Component Value Date/Time   CHOL 160 03/20/2021 1040   TRIG 96 03/20/2021 1040   HDL 64 03/20/2021 1040   CHOLHDL 2.5 03/20/2021 1040   LDLCALC 78 03/20/2021 1040    Hepatic Function Latest Ref Rng & Units 03/20/2021 06/03/2020 11/11/2019  Total Protein 6.1 - 8.1 g/dL 7.0 6.5 6.3  Albumin 3.6 - 5.1 g/dL - - -  AST 10 - 35 U/L _0 ALT 6 - 29 U/L _1 Alk Phosphatase 33 - 130 U/L - - -  Total Bilirubin 0.2 - 1.2 mg/dL 0.4 0.4 0.4    CBC Latest Ref Rng & Units 03/20/2021 02/10/2020 11/11/2019  WBC 3.8 - 10.8 Thousand/uL 4.6 4.6 5.0  Hemoglobin 11.7 - 15.5 g/dL 12.5 11.7 11.0(L)  Hematocrit 35.0 - 45.0 % 38.3 35.7 33.6(L)  Platelets 140 - 400 Thousand/uL 275 278 256    Clinical ASCVD: Yes  The 10-year ASCVD risk score (Arnett DK, et al., 2019) is: 22.2%   Values used to calculate the score:     Age: [age]     Sex: Female     Is Non-Hispanic African American: No     Diabetic: Yes     Tobacco smoker: No     Systolic Blood Pressure: 15111mHg     Is BP treated: Yes     HDL Cholesterol: 64 mg/dL     Total Cholesterol: 160 mg/dL    Social History   Tobacco Use  Smoking Status Never  Smokeless Tobacco Never   BP Readings from Last  3 Encounters:  03/20/21 (!) 150/63  08/31/20 (!) 143/63  06/01/20 128/78   Pulse Readings from Last 3 Encounters:  03/20/21 77  08/31/20 80  06/01/20 82   Wt Readings from Last 3 Encounters:  03/20/21 192 lb (87.1 kg)  08/31/20 194 lb (88 kg)  06/01/20 196 lb (88.9 kg)    Assessment: Review of patient past medical history, allergies, medications, health status, including review of consultants reports, laboratory and other test data, was performed as part of comprehensive evaluation and provision of chronic care management services.   SDOH:  (Social Determinants of Health) assessments and interventions performed:    CCM Care Plan  Allergies  Allergen Reactions    Farxiga [Dapagliflozin]     Recurrent yeast infections.    Penicillins     Medications Reviewed Today     Reviewed by Darius Bump, [facility] (Pharmacist) on 03/31/21 at 1017  Med List Status: <None>   Medication Order Taking? Sig Documenting Provider Last Dose Status Informant  AMBULATORY NON FORMULARY MEDICATION [id number] Yes Glucometer lancets and test strips for DM to check 1-2 times a day. Donella Stade, PA-C Taking Active   Ascorbic Acid (VITAMIN C PO) [id number] Yes Take by mouth. [provider] Taking Active   aspirin 81 MG tablet [id number] Yes Take 81 mg by mouth daily. [provider] Taking Active   Cholecalciferol (VITAMIN D PO) [id number] Yes Take by mouth. [provider] Taking Active   Ferrous Sulfate (IRON PO) [id number] Yes Take 1 tablet by mouth 3 (three) times a week. [provider] Taking Active   lisinopril (ZESTRIL) 5 MG tablet [id number] Yes Take 1 tablet (5 mg total) by mouth daily. Donella Stade, PA-C Taking Active   metFORMIN (GLUCOPHAGE) 1000 MG tablet [id number] Yes Take 1 tablet (1,000 mg total) by mouth 2 (two) times daily with a meal. [name], [name], PA-C Taking Active   pioglitazone (ACTOS) 45 MG tablet [id number] Yes Take 1 tablet (45 mg total) by mouth daily. Donella Stade, PA-C Taking Active   rosuvastatin (CRESTOR) 20 MG tablet [id number] Yes Take 1 tablet (20 mg total) by mouth daily.  Patient taking differently: Take 20 mg by mouth every other day.   [name], [name], PA-C Taking Active   Semaglutide (RYBELSUS) 14 MG TABS [id number] No Take 14 mg by mouth daily.  Patient not taking: Reported on 03/31/2021   Lavada Mesi Not Taking Active             Patient Active Problem List   Diagnosis Date Noted   Low serum iron 03/20/2021   Asymptomatic varicose veins of right lower extremity 03/20/2021   Foot callus 03/20/2021   Type II diabetes mellitus with complication, uncontrolled 06/01/2020   IDA  (iron deficiency anemia) 11/13/2019   Chronic right shoulder pain 08/03/2019   History of COVID-19 05/05/2019   Swelling of left index finger 06/11/2018   Breast cyst, left 04/11/2018   Moderate aortic valve stenosis 04/11/2018   Bicuspid aortic valve 04/11/2018   Carotid artery stenosis, asymptomatic, right 04/11/2018   Liver cyst 04/08/2018   Elevated liver enzymes 71/69/6789   Systolic ejection murmur 38/01/1750   Left carotid bruit 07/15/2017   Hyperlipidemia associated with type 2 diabetes mellitus ([place]) 04/14/2016   Obesity 01/04/2016   Dyslipidemia, goal LDL below 70 10/04/2015   Diabetes mellitus ([place]) 10/04/2015   Essential hypertension, benign 10/04/2015     There is no immunization history on file for this patient.  Conditions to be addressed/monitored: HTN, HLD, and DMII  There are no care plans that you recently modified to display for this patient.       Medication Assistance: Application for rybelsus (novonordisk)  medication assistance program. in process.  Anticipated assistance start date 11/04/20.  See plan of care for additional detail.  Patient's preferred pharmacy is:  [id number] - [place], [place] - [address] [place] [address] Phone: [phone] Fax: [phone]    Follow Up:  Patient agrees to Care Plan and Follow-up.  Plan: Telephone follow up appointment with care management team member scheduled for:  1 month   Larinda Buttery, PharmD Clinical Pharmacist [facility] [phone]

## 2021-05-31 NOTE — Patient Instructions (Addendum)
Ms Connie Cardenas, as we discussed, below is some information I've gathered about chromium supplementation and diabetes.  Effect of Chromium Supplementation on Blood Glucose and Lipid Levels in Patients with Type 2 Diabetes Mellitus: a Systematic Review and Meta-analysis (2022 published)   A total of 10 randomized controlled trials involving 509 patients were included, including 269 cases in the experimental group and 240 cases in the placebo control group. Statistical analysis was conducted on the glycosylated hemoglobin (HbA1c), fasting plasma glucose (FPG), triglyceride (TG), total cholesterol (TC), low-density lipoprotein (LDL), and high-density lipoprotein (HDL) to evaluate the blood glucose and lipid levels. Meta-analysis results showed that the differences between the experimental group and the control group in only one indicator of HbA1c were statistically significant, while there were no statistically significant differences in other indicators. The use of chromium supplements can reduce the glycosylated hemoglobin of type 2 diabetic patients to a certain extent, but it cannot effectively improve the fasting blood glucose and blood lipid levels of type 2 diabetic patients.  The bottom line: Chromium might help lower the A1c number, but has not been shown to effectively reduce fasting blood sugars. Therefore, it might be difficult to say it has a lot of benefit. However, chromium supplementation with approximately daily is unlikely to contribute to harm or toxicity. Below are also some opportunities to incorporate chromium naturally within the diet:  The Dietary Guidelines for Americans which is published by FDA-[place] for the following 5 years from 2015-2020 describes a healthy eating pattern as one that includes a:  - variety of vegetables, fruits, whole grains, fat-free or low-fat milk and milk products, and oils. - whole grain products and certain fruits and vegetables like broccoli, potatoes,  grape juice, and oranges are sources of Chromium. Ready-to-eat bran cereals can also be a relatively good source of Chromium. - includes a variety of protein foods, including seafood, lean meats and poultry, eggs, legumes (beans and peas), nuts, seeds, and soy products. - lean beef, oysters, eggs, and [place] are sources of Chromium - limits saturated and trans fats, added sugars, and sodium. - stays within your daily calorie needs  Hope this helps! [facility]   Visit Information  Thank you for taking time to visit with me today. Please don't hesitate to contact me if I can be of assistance to you before our next scheduled telephone appointment.  Following are the goals we discussed today:  Patient Goals/Self-Care Activities Over the next 90 days, patient will:  take medications as prescribed and check glucose 2x per day (AM/fasting, and ~2hr after a meal), document, and provide at future appointments  Follow Up Plan: Telephone follow up appointment with care management team member scheduled for:  4-5 months  Please call the care guide team at [phone] if you need to cancel or reschedule your appointment.    Patient verbalizes understanding of instructions and care plan provided today and agrees to view in MyChart. Active MyChart status confirmed with patient.    Connie Cardenas

## 2021-06-21 ENCOUNTER — Encounter: Payer: Self-pay | Admitting: Physician Assistant

## 2021-06-21 ENCOUNTER — Other Ambulatory Visit: Payer: Self-pay

## 2021-06-21 ENCOUNTER — Ambulatory Visit (INDEPENDENT_AMBULATORY_CARE_PROVIDER_SITE_OTHER): Payer: Medicare Other | Admitting: Physician Assistant

## 2021-06-21 VITALS — BP 132/82 | HR 80 | Ht 65.0 in | Wt 192.0 lb

## 2021-06-21 DIAGNOSIS — T148XXA Other injury of unspecified body region, initial encounter: Secondary | ICD-10-CM | POA: Diagnosis not present

## 2021-06-21 DIAGNOSIS — Z23 Encounter for immunization: Secondary | ICD-10-CM

## 2021-06-21 DIAGNOSIS — E1169 Type 2 diabetes mellitus with other specified complication: Secondary | ICD-10-CM

## 2021-06-21 DIAGNOSIS — E785 Hyperlipidemia, unspecified: Secondary | ICD-10-CM

## 2021-06-21 LAB — POCT GLYCOSYLATED HEMOGLOBIN (HGB A1C): Hemoglobin A1C: 6.8 % — AB (ref 4.0–5.6)

## 2021-06-21 NOTE — Progress Notes (Signed)
? ?Subjective:  ? ? Patient ID: Connie Cardenas, female    DOB: 03/27/1951, 71 y.o.   MRN: [id number] ? ?HPI ?Pt is a 71 yo female with T2DM, AS, Carotid stenosis, HLD, IDA who prents to the clinic for medications refills.  ? ?Overall pt is doing well. She stopped taking rybelcius due to cost on 21st. She is going to [place] and will get rest of medication there. No CP, palpitations, headaches, vision changes. No hypoglycemic events.  ? ?.. ?Active Ambulatory Problems  ?  Diagnosis Date Noted  ? Dyslipidemia, goal LDL below 70 10/04/2015  ? Diabetes mellitus (HCC) 10/04/2015  ? Essential hypertension, benign 10/04/2015  ? Obesity 01/04/2016  ? Hyperlipidemia associated with type 2 diabetes mellitus (HCC) 04/14/2016  ? Elevated liver enzymes 07/15/2017  ? Systolic ejection murmur 07/15/2017  ? Left carotid bruit 07/15/2017  ? Liver cyst 04/08/2018  ? Breast cyst, left 04/11/2018  ? Moderate aortic valve stenosis 04/11/2018  ? Bicuspid aortic valve 04/11/2018  ? Carotid artery stenosis, asymptomatic, right 04/11/2018  ? Swelling of left index finger 06/11/2018  ? History of COVID-19 05/05/2019  ? Chronic right shoulder pain 08/03/2019  ? IDA (iron deficiency anemia) 11/13/2019  ? Type II diabetes mellitus with complication, uncontrolled 06/01/2020  ? Low serum iron 03/20/2021  ? Asymptomatic varicose veins of right lower extremity 03/20/2021  ? Foot callus 03/20/2021  ? ?Resolved Ambulatory Problems  ?  Diagnosis Date Noted  ? COVID-19 04/22/2019  ? ?Past Medical History:  ?Diagnosis Date  ? Diabetes mellitus without complication (HCC)   ? Hypertension   ? ? ? ? ?Review of Systems  ?All other systems reviewed and are negative. ? ?   ?Objective:  ? Physical Exam ?Vitals reviewed.  ?Constitutional:   ?   Appearance: Normal appearance. She is obese.  ?HENT:  ?   Head: Normocephalic.  ?Neck:  ?   Comments: Carotid bruit ?Cardiovascular:  ?   Rate and Rhythm: Normal rate and regular rhythm.  ?   Heart sounds: Murmur  heard.  ?Pulmonary:  ?   Effort: Pulmonary effort is normal.  ?   Breath sounds: Normal breath sounds.  ?Abdominal:  ?   General: Bowel sounds are normal.  ?   Palpations: Abdomen is soft.  ?Musculoskeletal:  ?   Right lower leg: No edema.  ?   Left lower leg: No edema.  ?Skin: ?   Comments: Abrasions of upper arm  ?Neurological:  ?   General: No focal deficit present.  ?   Mental Status: [name], place, and time.  ?Psychiatric:     ?   Mood and Affect: Mood normal.  ? ? ? ? ? ?  .. ?Results for orders placed or performed in visit on 06/21/21  ?POCT glycosylated hemoglobin (Hb A1C)  ?Result Value Ref Range  ? Hemoglobin A1C 6.8 (A) 4.0 - 5.6 %  ? HbA1c POC (<> result, manual entry)    ? HbA1c, POC (prediabetic range)    ? HbA1c, POC (controlled diabetic range)    ? ? ?Assessment & Plan:  ?..[name] was seen today for follow-up and diabetes. ? ?Diagnoses and all orders for this visit: ? ?Type 2 diabetes mellitus with other specified complication, without long-term current use of insulin (HCC) ?-     POCT glycosylated hemoglobin (Hb A1C) ? ?Hyperlipidemia associated with type 2 diabetes mellitus (HCC) ? ?Need for Tdap vaccination ?-     Tdap vaccine greater  than or equal to 7yo IM ? ?Dyslipidemia, goal LDL below 70 ? ?Abrasion ?-     Tdap vaccine greater than or equal to 7yo IM ? ? ?A1c to goal ?Continue same medications ?BP close to goal. On ACE ?On statin.  ?Foot and eye exam UTD.  ?Declined covid,pneumonia, flu, shingles vaccine.  ?Follow up in3 months.  ? ?Tdap given today.  ? ? ?

## 2021-07-03 ENCOUNTER — Telehealth: Payer: Self-pay | Admitting: Neurology

## 2021-07-03 NOTE — Telephone Encounter (Signed)
Patient called and left vm that she does want to proceed with pneumonia vaccine. Needs a nurse visit scheduled for Pneumo 20. Thanks! ?

## 2021-07-03 NOTE — Telephone Encounter (Signed)
Patient has been scheduled for a NV for tomorrow. AM ?

## 2021-07-04 ENCOUNTER — Other Ambulatory Visit: Payer: Self-pay

## 2021-07-04 ENCOUNTER — Ambulatory Visit (INDEPENDENT_AMBULATORY_CARE_PROVIDER_SITE_OTHER): Payer: Medicare Other | Admitting: Physician Assistant

## 2021-07-04 VITALS — BP 120/55 | HR 91

## 2021-07-04 DIAGNOSIS — Z23 Encounter for immunization: Secondary | ICD-10-CM

## 2021-07-04 NOTE — Progress Notes (Signed)
Pt here for Prevnar 20 vaccine.  Pt tolerated injection well.  Tiajuana Amass, CMA ? ?

## 2021-07-05 NOTE — Progress Notes (Signed)
Agree with above plan. 

## 2021-07-08 ENCOUNTER — Other Ambulatory Visit: Payer: Self-pay | Admitting: Physician Assistant

## 2021-09-20 ENCOUNTER — Other Ambulatory Visit: Payer: Self-pay | Admitting: Physician Assistant

## 2021-09-20 DIAGNOSIS — Z1231 Encounter for screening mammogram for malignant neoplasm of breast: Secondary | ICD-10-CM

## 2021-09-26 ENCOUNTER — Ambulatory Visit (INDEPENDENT_AMBULATORY_CARE_PROVIDER_SITE_OTHER): Payer: Medicare Other | Admitting: Pharmacist

## 2021-09-26 DIAGNOSIS — E1169 Type 2 diabetes mellitus with other specified complication: Secondary | ICD-10-CM

## 2021-09-26 DIAGNOSIS — I10 Essential (primary) hypertension: Secondary | ICD-10-CM

## 2021-09-26 NOTE — Progress Notes (Signed)
Chronic Care Management Pharmacy Note  09/26/2021 Name:  Connie Cardenas MRN:  [id number] DOB:  1950/12/03  Summary: addressed DM, HTN, HLD. Patient is on Rybelsus 7mg  since May 10th, obtained through [place] due to [place].  Has 3 month supply, should last her through August. [place], [place]. Awaiting date of surgery.  BG readings are 125 - 150 Physical activity - busy with grandchildren.   Recommendations/Changes made from today's visit:  No changes, continue current medications & lifestyle modifications.   Plan: f/u with pharmacist in 6 months  Subjective: Connie Cardenas is an 71 y.o. year old female who is a primary patient of Jomarie LongsBreeback, [name], PA-C.  The CCM team was consulted for assistance with disease management and care coordination needs.    Engaged with patient by telephone for follow up visit in response to provider referral for pharmacy case management and/or care coordination services.   Consent to Services:  The patient was given information about Chronic Care Management services, agreed to services, and gave verbal consent prior to initiation of services.  Please see initial visit note for detailed documentation.   Patient Care Team: Nolene EbbsBreeback, [name], PA-C as PCP - General (Family Medicine)   Objective:  Lab Results  Component Value Date   CREATININE 0.58 03/20/2021   CREATININE 0.66 06/03/2020   CREATININE 0.75 11/11/2019    Lab Results  Component Value Date   HGBA1C 6.8 (A) 06/21/2021   Last diabetic Eye exam:  Lab Results  Component Value Date/Time   HMDIABEYEEXA No Retinopathy 10/30/2019 12:00 AM        Component Value Date/Time   CHOL 160 03/20/2021 1040   TRIG 96 03/20/2021 1040   HDL 64 03/20/2021 1040   CHOLHDL 2.5 03/20/2021 1040   LDLCALC 78 03/20/2021 1040       Latest Ref Rng & Units 03/20/2021   10:40 AM 06/03/2020    9:44 AM 11/11/2019    8:21 AM  Hepatic  Function  Total Protein 6.1 - 8.1 g/dL 7.0   6.5   6.3    AST 10 - 35 U/L 17   18   18     ALT 6 - 29 U/L 12   15   15     Total Bilirubin 0.2 - 1.2 mg/dL 0.4   0.4   0.4         Latest Ref Rng & Units 03/20/2021   10:40 AM 02/10/2020    8:21 AM 11/11/2019    8:21 AM  CBC  WBC 3.8 - 10.8 Thousand/uL 4.6   4.6   5.0    Hemoglobin 11.7 - 15.5 g/dL 40.912.5   81.111.7   91.411.0    Hematocrit 35.0 - 45.0 % 38.3   35.7   33.6    Platelets 140 - 400 Thousand/uL 275   278   256      Clinical ASCVD: Yes  The 10-year ASCVD risk score (Arnett DK, et al., 2019) is: 14.8%   Values used to calculate the score:     Age: [age] years     Sex: Female     Is Non-Hispanic African American: No     Diabetic: Yes     Tobacco smoker: No     Systolic Blood Pressure: 120 mmHg     Is BP treated: Yes     HDL Cholesterol: 64 mg/dL     Total Cholesterol: 160 mg/dL    Social History  Tobacco Use  Smoking Status Never  Smokeless Tobacco Never   BP Readings from Last 3 Encounters:  07/04/21 (!) 120/55  06/21/21 132/82  03/20/21 (!) 150/63   Pulse Readings from Last 3 Encounters:  07/04/21 91  06/21/21 80  03/20/21 77   Wt Readings from Last 3 Encounters:  06/21/21 192 lb (87.1 kg)  03/20/21 192 lb (87.1 kg)  08/31/20 194 lb (88 kg)    Assessment: Review of patient past medical history, allergies, medications, health status, including review of consultants reports, laboratory and other test data, was performed as part of comprehensive evaluation and provision of chronic care management services.   SDOH:  (Social Determinants of Health) assessments and interventions performed:    CCM Care Plan  Allergies  Allergen Reactions   Farxiga [Dapagliflozin]     Recurrent yeast infections.    Penicillins     Medications Reviewed Today     Reviewed by Nolene Ebbs (Physician Assistant) on 06/21/21 at 0750  Med List Status: <None>   Medication Order Taking? Sig Documenting Provider Last Dose  Status Informant  AMBULATORY NON FORMULARY MEDICATION [id number] Yes Glucometer lancets and test strips for DM to check 1-2 times a day. Jomarie Longs, PA-C Taking Active   Ascorbic Acid (VITAMIN C PO) [id number] Yes Take by mouth. [provider] Taking Active   aspirin 81 MG tablet [id number] Yes Take 81 mg by mouth daily. [provider] Taking Active   Cholecalciferol (VITAMIN D PO) [id number] Yes Take by mouth. [provider] Taking Active   Ferrous Sulfate (IRON PO) [id number] Yes Take 1 tablet by mouth 3 (three) times a week. [provider] Taking Active   lisinopril (ZESTRIL) 5 MG tablet [id number] Yes Take 1 tablet (5 mg total) by mouth daily. Jomarie Longs, PA-C Taking Active   metFORMIN (GLUCOPHAGE) 1000 MG tablet [id number] Yes Take 1 tablet (1,000 mg total) by mouth 2 (two) times daily with a meal. [name], [name], PA-C Taking Active   pioglitazone (ACTOS) 45 MG tablet [id number] Yes Take 1 tablet (45 mg total) by mouth daily. Jomarie Longs, PA-C Taking Active   rosuvastatin (CRESTOR) 20 MG tablet [id number] Yes Take 1 tablet (20 mg total) by mouth daily.  Patient taking differently: Take 20 mg by mouth every other day.   [name], [name], PA-C Taking Active   Semaglutide (RYBELSUS) 14 MG TABS [id number] No Take 14 mg by mouth daily.  Patient not taking: Reported on 03/31/2021   Nolene Ebbs Not Taking Active             Patient Active Problem List   Diagnosis Date Noted   Low serum iron 03/20/2021   Asymptomatic varicose veins of right lower extremity 03/20/2021   Foot callus 03/20/2021   Type II diabetes mellitus with complication, uncontrolled 06/01/2020   IDA (iron deficiency anemia) 11/13/2019   Chronic right shoulder pain 08/03/2019   History of COVID-19 05/05/2019   Swelling of left index finger 06/11/2018   Breast cyst, left 04/11/2018   Moderate aortic valve stenosis 04/11/2018   Bicuspid aortic valve 04/11/2018    Carotid artery stenosis, asymptomatic, right 04/11/2018   Liver cyst 04/08/2018   Elevated liver enzymes 07/15/2017   Systolic ejection murmur 07/15/2017   Left carotid bruit 07/15/2017   Hyperlipidemia associated with type 2 diabetes mellitus (HCC) 04/14/2016   Obesity 01/04/2016   Dyslipidemia, goal LDL below 70 10/04/2015   Diabetes mellitus (HCC) 10/04/2015   Essential  hypertension, benign 10/04/2015    Immunization History  Administered Date(s) Administered   PNEUMOCOCCAL CONJUGATE-20 07/04/2021   Tdap 06/21/2021    Conditions to be addressed/monitored: HTN, HLD, and DMII  There are no care plans that you recently modified to display for this patient.       Medication Assistance: Application for rybelsus (novonordisk)  medication assistance program. in process.  Anticipated assistance start date 11/04/20.  See plan of care for additional detail.  Patient's preferred pharmacy is:  [id number] - [place], [place] - [address] AT [facility] [address] [place] [address] Phone: [phone] Fax: [phone]    Follow Up:  Patient agrees to Care Plan and Follow-up.  Plan: Telephone follow up appointment with care management team member scheduled for:  6 months   Lynnda Shields, PharmD Clinical Pharmacist [facility] [phone]

## 2021-09-27 ENCOUNTER — Encounter: Payer: Self-pay | Admitting: Physician Assistant

## 2021-09-27 ENCOUNTER — Ambulatory Visit (INDEPENDENT_AMBULATORY_CARE_PROVIDER_SITE_OTHER): Payer: Medicare Other | Admitting: Physician Assistant

## 2021-09-27 VITALS — BP 132/74 | HR 80 | Ht 65.0 in | Wt 194.0 lb

## 2021-09-27 DIAGNOSIS — I10 Essential (primary) hypertension: Secondary | ICD-10-CM

## 2021-09-27 DIAGNOSIS — E6609 Other obesity due to excess calories: Secondary | ICD-10-CM

## 2021-09-27 DIAGNOSIS — I350 Nonrheumatic aortic (valve) stenosis: Secondary | ICD-10-CM | POA: Diagnosis not present

## 2021-09-27 DIAGNOSIS — Z6832 Body mass index (BMI) 32.0-32.9, adult: Secondary | ICD-10-CM

## 2021-09-27 DIAGNOSIS — I6521 Occlusion and stenosis of right carotid artery: Secondary | ICD-10-CM

## 2021-09-27 DIAGNOSIS — E785 Hyperlipidemia, unspecified: Secondary | ICD-10-CM | POA: Diagnosis not present

## 2021-09-27 DIAGNOSIS — E1169 Type 2 diabetes mellitus with other specified complication: Secondary | ICD-10-CM | POA: Diagnosis not present

## 2021-09-27 LAB — POCT GLYCOSYLATED HEMOGLOBIN (HGB A1C): HbA1c POC (<> result, manual entry): 6.9 % (ref 4.0–5.6)

## 2021-09-27 NOTE — Progress Notes (Signed)
Established Patient Office Visit  Subjective   Patient ID: Connie Cardenas, female    DOB: 1950-05-20  Age: 71 y.o. MRN: [id number]  Chief Complaint  Patient presents with   Diabetes    HPI Pt is a 71 yo obese female with T2DM, HTN, AV stenosis, CAS, HLD who presents to the clinic for follow up.   Pt is doing well.  Patient is checking her sugars most mornings and usually around 130 however this morning it was 155.  She admits she is not exercising.  She denies any hypoglycemic events.  She denies any open sores or wounds.  She is not having any chest pain, palpitations, headaches or vision changes.  She is being compliant with her diabetic medications which are Rybelsus, metformin, Actos.  She is under quite a bit of stress with her husband needing a aortic valve replacement.   .. Active Ambulatory Problems    Diagnosis Date Noted   Dyslipidemia, goal LDL below 70 10/04/2015   Diabetes mellitus (HCC) 10/04/2015   Essential hypertension, benign 10/04/2015   Class 1 obesity due to excess calories with serious comorbidity and body mass index (BMI) of 32.0 to 32.9 in adult 01/04/2016   Hyperlipidemia associated with type 2 diabetes mellitus (HCC) 04/14/2016   Elevated liver enzymes 07/15/2017   Systolic ejection murmur 07/15/2017   Left carotid bruit 07/15/2017   Liver cyst 04/08/2018   Breast cyst, left 04/11/2018   Moderate aortic valve stenosis 04/11/2018   Bicuspid aortic valve 04/11/2018   Carotid artery stenosis, asymptomatic, right 04/11/2018   Swelling of left index finger 06/11/2018   History of COVID-19 05/05/2019   Chronic right shoulder pain 08/03/2019   IDA (iron deficiency anemia) 11/13/2019   Type II diabetes mellitus with complication, uncontrolled 06/01/2020   Low serum iron 03/20/2021   Asymptomatic varicose veins of right lower extremity 03/20/2021   Foot callus 03/20/2021   Resolved Ambulatory Problems    Diagnosis Date Noted   COVID-19 04/22/2019    Past Medical History:  Diagnosis Date   Diabetes mellitus without complication (HCC)    Hypertension      Review of Systems  All other systems reviewed and are negative.    Objective:     BP (!) 148/65   Pulse 80   Ht 5\' 5"  (1.651 m)   Wt 194 lb (88 kg)   SpO2 99%   BMI 32.28 kg/m  BP Readings from Last 3 Encounters:  09/27/21 (!) 148/65  07/04/21 (!) 120/55  06/21/21 132/82   Wt Readings from Last 3 Encounters:  09/27/21 194 lb (88 kg)  06/21/21 192 lb (87.1 kg)  03/20/21 192 lb (87.1 kg)    .03/22/21 Results for orders placed or performed in visit on 09/27/21  POCT glycosylated hemoglobin (Hb A1C)  Result Value Ref Range   Hemoglobin A1C     HbA1c POC (<> result, manual entry) 6.9 4.0 - 5.6 %   HbA1c, POC (prediabetic range)     HbA1c, POC (controlled diabetic range)       Physical Exam Vitals reviewed.  Constitutional:      Appearance: Normal appearance. She is obese.  HENT:     Head: Normocephalic.  Cardiovascular:     Rate and Rhythm: Normal rate and regular rhythm.     Pulses: Normal pulses.     Heart sounds: Murmur heard.     Comments: Loud 5/6 SEM with radiation into right carotid artery Pulmonary:     Effort: Pulmonary  effort is normal.     Breath sounds: Normal breath sounds.  Musculoskeletal:     Right lower leg: No edema.     Left lower leg: No edema.     Comments: 2+ pedal pulses  Skin:    Capillary Refill: Capillary refill takes less than 2 seconds.  Neurological:     General: No focal deficit present.     Mental Status: [name], place, and time.  Psychiatric:        Mood and Affect: Mood normal.        Assessment & Plan:  Marland KitchenMarland KitchenEavan was seen today for diabetes.  Diagnoses and all orders for this visit:  Type 2 diabetes mellitus with other specified complication, without long-term current use of insulin (HCC) -     POCT glycosylated hemoglobin (Hb A1C) -     COMPLETE METABOLIC PANEL WITH GFR -      Fe+TIBC+Fer  Moderate aortic valve stenosis  Essential hypertension, benign -     COMPLETE METABOLIC PANEL WITH GFR -     Fe+TIBC+Fer  Dyslipidemia, goal LDL below 70  Class 1 obesity due to excess calories with serious comorbidity and body mass index (BMI) of 32.0 to 32.9 in adult   A1C to goal Stay on current medications Continue to work on weight loss BP to goal on 2nd recheck On statin Eye and foot exam UTD Covid x2, declines booster Needs shingles Pneumonia 20 UTD Flu shot UTD.  Follow up in 3 months  Followed by vascular for CAS and AV stenosis.      Return in about 3 months (around 12/28/2021) for DM.    [name], PA-C

## 2021-09-28 NOTE — Patient Instructions (Signed)
Visit Information  Thank you for taking time to visit with me today. Please don't hesitate to contact me if I can be of assistance to you before our next scheduled telephone appointment.  Following are the goals we discussed today:  Patient Goals/Self-Care Activities Over the next 180 days, patient will:  take medications as prescribed and check glucose daily, document, and provide at future appointments  Follow Up Plan: Telephone follow up appointment with care management team member scheduled for:  6 months  Please call the care guide team at [phone] if you need to cancel or reschedule your appointment.   Patient verbalizes understanding of instructions and care plan provided today and agrees to view in MyChart. Active MyChart status and patient understanding of how to access instructions and care plan via MyChart confirmed with patient.     Gabriel Carina

## 2021-10-02 ENCOUNTER — Ambulatory Visit
Admission: RE | Admit: 2021-10-02 | Discharge: 2021-10-02 | Disposition: A | Discharge status: Home / Self Care | Payer: Medicare Other | Source: Ambulatory Visit | Attending: Physician Assistant | Admitting: Physician Assistant

## 2021-10-02 DIAGNOSIS — Z1231 Encounter for screening mammogram for malignant neoplasm of breast: Secondary | ICD-10-CM

## 2021-10-03 LAB — COMPLETE METABOLIC PANEL WITH GFR
AG Ratio: 1.5 (calc) (ref 1.0–2.5)
ALT: 11 U/L (ref 6–29)
AST: 20 U/L (ref 10–35)
Albumin: 4.2 g/dL (ref 3.6–5.1)
Alkaline phosphatase (APISO): 67 U/L (ref 37–153)
BUN: 15 mg/dL (ref 7–25)
CO2: 26 mmol/L (ref 20–32)
Calcium: 9.3 mg/dL (ref 8.6–10.4)
Chloride: 102 mmol/L (ref 98–110)
Creat: 0.72 mg/dL (ref 0.50–1.05)
Globulin: 2.8 g/dL (calc) (ref 1.9–3.7)
Glucose, Bld: 109 mg/dL — ABNORMAL HIGH (ref 65–99)
Potassium: 4.4 mmol/L (ref 3.5–5.3)
Sodium: 138 mmol/L (ref 135–146)
Total Bilirubin: 0.5 mg/dL (ref 0.2–1.2)
Total Protein: 7 g/dL (ref 6.1–8.1)
eGFR: 91 mL/min/{1.73_m2} (ref >=60)

## 2021-10-03 LAB — IRON,TIBC AND FERRITIN PANEL
%SAT: 15 % (calc) — ABNORMAL LOW (ref 16–45)
Ferritin: 15 ng/mL — ABNORMAL LOW (ref 16–288)
Iron: 60 ug/dL (ref 45–160)
TIBC: 398 mcg/dL (calc) (ref 250–450)

## 2021-10-03 NOTE — Progress Notes (Signed)
Kidney, liver, and electrolytes look great.  Serum iron stable.  Iron stores still really low. Do you think you could increase iron to once daily with vitamin C to help with absorption?

## 2021-10-03 NOTE — Progress Notes (Signed)
Normal mammogram. Follow up in 1 year.

## 2021-10-09 ENCOUNTER — Other Ambulatory Visit: Payer: Self-pay | Admitting: Neurology

## 2021-10-09 DIAGNOSIS — E1169 Type 2 diabetes mellitus with other specified complication: Secondary | ICD-10-CM

## 2021-10-10 MED ORDER — METFORMIN HCL 1000 MG PO TABS: 1000.0000 mg | ORAL_TABLET | Freq: Two times a day (BID) | ORAL | 1 refills | Status: DC

## 2021-10-10 NOTE — Addendum Note (Signed)
Addended bySilvio Pate on: 10/10/2021 04:21 PM   Modules accepted: Orders

## 2021-10-10 NOTE — Telephone Encounter (Signed)
Patient called for refill of Metformin, pharmacy states we denied but never received request. Patient made aware. Refilled.

## 2021-11-24 ENCOUNTER — Other Ambulatory Visit: Payer: Self-pay | Admitting: Neurology

## 2021-11-24 DIAGNOSIS — E119 Type 2 diabetes mellitus without complications: Secondary | ICD-10-CM

## 2021-11-24 DIAGNOSIS — I10 Essential (primary) hypertension: Secondary | ICD-10-CM

## 2021-11-24 MED ORDER — LISINOPRIL 5 MG PO TABS: 5.0000 mg | ORAL_TABLET | Freq: Every day | ORAL | 1 refills | Status: DC

## 2021-12-09 ENCOUNTER — Other Ambulatory Visit: Payer: Self-pay | Admitting: Physician Assistant

## 2021-12-09 DIAGNOSIS — E1169 Type 2 diabetes mellitus with other specified complication: Secondary | ICD-10-CM

## 2021-12-13 ENCOUNTER — Encounter: Payer: Self-pay | Admitting: General Practice

## 2022-01-03 ENCOUNTER — Ambulatory Visit (INDEPENDENT_AMBULATORY_CARE_PROVIDER_SITE_OTHER): Payer: Medicare Other | Admitting: Physician Assistant

## 2022-01-03 ENCOUNTER — Encounter: Payer: Self-pay | Admitting: Physician Assistant

## 2022-01-03 VITALS — BP 118/69 | HR 85 | Wt 189.0 lb

## 2022-01-03 DIAGNOSIS — I350 Nonrheumatic aortic (valve) stenosis: Secondary | ICD-10-CM | POA: Diagnosis not present

## 2022-01-03 DIAGNOSIS — E785 Hyperlipidemia, unspecified: Secondary | ICD-10-CM | POA: Diagnosis not present

## 2022-01-03 DIAGNOSIS — E1169 Type 2 diabetes mellitus with other specified complication: Secondary | ICD-10-CM | POA: Diagnosis not present

## 2022-01-03 DIAGNOSIS — I6521 Occlusion and stenosis of right carotid artery: Secondary | ICD-10-CM | POA: Diagnosis not present

## 2022-01-03 DIAGNOSIS — Z6832 Body mass index (BMI) 32.0-32.9, adult: Secondary | ICD-10-CM

## 2022-01-03 DIAGNOSIS — I10 Essential (primary) hypertension: Secondary | ICD-10-CM

## 2022-01-03 DIAGNOSIS — E6609 Other obesity due to excess calories: Secondary | ICD-10-CM

## 2022-01-03 LAB — POCT GLYCOSYLATED HEMOGLOBIN (HGB A1C): Hemoglobin A1C: 6.5 % — AB (ref 4.0–5.6)

## 2022-01-03 LAB — POCT UA - MICROALBUMIN
Albumin/Creatinine Ratio, Urine, POC: 30
Creatinine, POC: 300 mg/dL
Microalbumin Ur, POC: 30 mg/L

## 2022-01-03 NOTE — Patient Instructions (Signed)
I will reorder echo.  Follow up with vascular for repeat duplex.

## 2022-01-03 NOTE — Progress Notes (Signed)
Established Patient Office Visit  Subjective   Patient ID: Connie Cardenas, female    DOB: 09-29-1950  Age: 71 y.o. MRN: [id number]  Chief Complaint  Patient presents with   Diabetes    HPI Pt is a 71 yo female with T2DM, aortic stenosis, HLD, bilateral Carotid artery stenosis who presents to the clinic for follow up.   She is checking her sugars and ranging 90-130 in the mornings. She is on metformin, Actos, rybelsus. She is active and doing well. No open sores or wounds. No CP, palpitations, or headaches.   She has not seen vascular in over a year. She has had some intermittent dizziness but no swelling or SOB.   Patient Active Problem List   Diagnosis Date Noted   Low serum iron 03/20/2021   Asymptomatic varicose veins of right lower extremity 03/20/2021   Foot callus 03/20/2021   Type II diabetes mellitus with complication, uncontrolled 06/01/2020   IDA (iron deficiency anemia) 11/13/2019   Chronic right shoulder pain 08/03/2019   History of COVID-19 05/05/2019   Swelling of left index finger 06/11/2018   Breast cyst, left 04/11/2018   Moderate aortic valve stenosis 04/11/2018   Bicuspid aortic valve 04/11/2018   Carotid artery stenosis, asymptomatic, right 04/11/2018   Liver cyst 04/08/2018   Elevated liver enzymes 07/15/2017   Systolic ejection murmur 07/15/2017   Left carotid bruit 07/15/2017   Hyperlipidemia associated with type 2 diabetes mellitus (HCC) 04/14/2016   Class 1 obesity due to excess calories with serious comorbidity and body mass index (BMI) of 32.0 to 32.9 in adult 01/04/2016   Dyslipidemia, goal LDL below 70 10/04/2015   Diabetes mellitus (HCC) 10/04/2015   Essential hypertension, benign 10/04/2015   Past Medical History:  Diagnosis Date   Diabetes mellitus without complication (HCC)    Hypertension    Family History  Problem Relation Age of Onset   Cancer Sister        non-hodkins lymphoma   Cancer Mother        colon    Hypertension Mother    Heart attack Father    Hyperlipidemia Father    Stroke Paternal Grandmother    Allergies  Allergen Reactions   Farxiga [Dapagliflozin]     Recurrent yeast infections.    Penicillins       ROS   See HPI.  Objective:     BP 118/69   Pulse 85   Wt 189 lb (85.7 kg)   SpO2 99%   BMI 31.45 kg/m  BP Readings from Last 3 Encounters:  01/03/22 118/69  09/27/21 132/74  07/04/21 (!) 120/55   Wt Readings from Last 3 Encounters:  01/03/22 189 lb (85.7 kg)  09/27/21 194 lb (88 kg)  06/21/21 192 lb (87.1 kg)    .Marland Kitchen Results for orders placed or performed in visit on 01/03/22  POCT glycosylated hemoglobin (Hb A1C)  Result Value Ref Range   Hemoglobin A1C 6.5 (A) 4.0 - 5.6 %   HbA1c POC (<> result, manual entry)     HbA1c, POC (prediabetic range)     HbA1c, POC (controlled diabetic range)    POCT UA - Microalbumin  Result Value Ref Range   Microalbumin Ur, POC 30 mg/L   Creatinine, POC 300 mg/dL   Albumin/Creatinine Ratio, Urine, POC 30      Physical Exam Constitutional:      Appearance: Normal appearance. She is obese.  HENT:     Head: Normocephalic.  Neck:  Comments: Carotid bruit, right.  Murmur that radiates into both carotids  Cardiovascular:     Rate and Rhythm: Normal rate and regular rhythm.     Heart sounds: Murmur heard.  Pulmonary:     Effort: Pulmonary effort is normal.     Breath sounds: Normal breath sounds.  Musculoskeletal:     Right lower leg: No edema.     Left lower leg: No edema.  Neurological:     General: No focal deficit present.     Mental Status: She is alert.  Psychiatric:        Mood and Affect: Mood normal.        Assessment & Plan:  Marland KitchenMarland KitchenAdrea was seen today for diabetes.  Diagnoses and all orders for this visit:  Type 2 diabetes mellitus with other specified complication, without long-term current use of insulin (HCC) -     POCT glycosylated hemoglobin (Hb A1C) -     POCT UA -  Microalbumin  Moderate aortic valve stenosis -     ECHOCARDIOGRAM COMPLETE; Future -     Cardiac Stress Test: Informed Consent Details: Physician/Practitioner Attestation; Transcribe to consent form and obtain patient signature  Essential hypertension, benign  Dyslipidemia, goal LDL below 70  Class 1 obesity due to excess calories with serious comorbidity and body mass index (BMI) of 32.0 to 32.9 in adult  Carotid artery stenosis, asymptomatic, right   A1C to goal Continue on same medications BP to goal, continue on ACE On statin Foot exam UTD Eye exam UTD Declined flu/covid/pneumonia/shingles vaccine.  Follow up in 3 months  Needs follow up with vascular and carotid duplex.  Please call and schedule.  No recent echo due to aortic stenosis. Needs to be repeated.    [name], PA-C

## 2022-01-12 ENCOUNTER — Ambulatory Visit (HOSPITAL_COMMUNITY)
Admission: RE | Admit: 2022-01-12 | Discharge: 2022-01-12 | Disposition: A | Discharge status: Home / Self Care | Payer: Medicare Other | Source: Ambulatory Visit | Attending: Physician Assistant | Admitting: Physician Assistant

## 2022-01-12 DIAGNOSIS — E119 Type 2 diabetes mellitus without complications: Secondary | ICD-10-CM | POA: Insufficient documentation

## 2022-01-12 DIAGNOSIS — E785 Hyperlipidemia, unspecified: Secondary | ICD-10-CM | POA: Diagnosis not present

## 2022-01-12 DIAGNOSIS — I350 Nonrheumatic aortic (valve) stenosis: Secondary | ICD-10-CM | POA: Insufficient documentation

## 2022-01-12 DIAGNOSIS — I10 Essential (primary) hypertension: Secondary | ICD-10-CM | POA: Insufficient documentation

## 2022-01-12 DIAGNOSIS — I352 Nonrheumatic aortic (valve) stenosis with insufficiency: Secondary | ICD-10-CM | POA: Diagnosis not present

## 2022-01-12 LAB — ECHOCARDIOGRAM COMPLETE
AR max vel: 1.11 cm2
AV Area VTI: 1.12 cm2
AV Area mean vel: 1.1 cm2
AV Mean grad: 28.8 mmHg
AV Peak grad: 47.7 mmHg
Ao pk vel: 3.45 m/s
Area-P 1/2: 3.27 cm2
P 1/2 time: 469 msec
S' Lateral: 2.9 cm

## 2022-01-12 NOTE — Progress Notes (Signed)
Pat,   Ejection Fraction looks great! Known Moderate aortic valve stenosis.  Small regurgitation of mitral valve.  Mildly enlarged atrium  Overall stable echo that looks good.

## 2022-01-12 NOTE — Progress Notes (Signed)
  Echocardiogram 2D Echocardiogram has been performed.  Connie Cardenas 01/12/2022, 8:55 AM

## 2022-03-27 ENCOUNTER — Ambulatory Visit (INDEPENDENT_AMBULATORY_CARE_PROVIDER_SITE_OTHER): Payer: Medicare Other | Admitting: Pharmacist

## 2022-03-27 DIAGNOSIS — E785 Hyperlipidemia, unspecified: Secondary | ICD-10-CM

## 2022-03-27 DIAGNOSIS — I10 Essential (primary) hypertension: Secondary | ICD-10-CM

## 2022-03-27 DIAGNOSIS — E1169 Type 2 diabetes mellitus with other specified complication: Secondary | ICD-10-CM

## 2022-03-27 NOTE — Progress Notes (Signed)
Chronic Care Management Pharmacy Note  03/27/2022 Name:  Connie Cardenas MRN:  [id number] DOB:  1950/10/04  Summary: addressed DM, HTN, HLD. Patient is on Rybelsus 7mg  since May 2023, obtained through 11-17-1983 when husband is doing mission work, due to [place]. cost.  [place], [place]. Looking to get back to [employer] in more of a part time capacity. Patient BG readings are 120s, and BP readings 120/70s. Physical activity - busy with grandchildren.   Recommendations/Changes made from today's visit:  No changes, continue current medications & lifestyle modifications.   Plan: f/u with pharmacist in 6-12 months  Subjective: Connie Cardenas is an 71 y.o. year old female who is a primary patient of 71, PA-C.  The CCM team was consulted for assistance with disease management and care coordination needs.    Engaged with patient by telephone for follow up visit in response to provider referral for pharmacy case management and/or care coordination services.   Consent to Services:  The patient was given information about Chronic Care Management services, agreed to services, and gave verbal consent prior to initiation of services.  Please see initial visit note for detailed documentation.   Patient Care Team: Jomarie Longs as PCP - General (Family Medicine)   Objective:  Lab Results  Component Value Date   CREATININE 0.72 10/02/2021   CREATININE 0.58 03/20/2021   CREATININE 0.66 06/03/2020    Lab Results  Component Value Date   HGBA1C 6.5 (A) 01/03/2022   Last diabetic Eye exam:  Lab Results  Component Value Date/Time   HMDIABEYEEXA No Retinopathy 10/30/2019 12:00 AM        Component Value Date/Time   CHOL 160 03/20/2021 1040   TRIG 96 03/20/2021 1040   HDL 64 03/20/2021 1040   CHOLHDL 2.5 03/20/2021 1040   LDLCALC 78 03/20/2021 1040       Latest Ref Rng & Units 10/02/2021   12:00 AM  03/20/2021   10:40 AM 06/03/2020    9:44 AM  Hepatic Function  Total Protein 6.1 - 8.1 g/dL 7.0  7.0  6.5   AST 10 - 35 U/L 20  17  18    ALT 6 - 29 U/L 11  12  15    Total Bilirubin 0.2 - 1.2 mg/dL 0.5  0.4  0.4        Latest Ref Rng & Units 03/20/2021   10:40 AM 02/10/2020    8:21 AM 11/11/2019    8:21 AM  CBC  WBC 3.8 - 10.8 Thousand/uL 4.6  4.6  5.0   Hemoglobin 11.7 - 15.5 g/dL 03/22/2021  02/12/2020  11/13/2019   Hematocrit 35.0 - 45.0 % 38.3  35.7  33.6   Platelets 140 - 400 Thousand/uL 275  278  256     Clinical ASCVD: Yes  The 10-year ASCVD risk score (Arnett DK, et al., 2019) is: 14.4%   Values used to calculate the score:     Age: 71 years     Sex: Female     Is Non-Hispanic African American: No     Diabetic: Yes     Tobacco smoker: No     Systolic Blood Pressure: 118 mmHg     Is BP treated: Yes     HDL Cholesterol: 64 mg/dL     Total Cholesterol: 160 mg/dL    Social History   Tobacco Use  Smoking Status Never  Smokeless Tobacco Never   BP Readings  from Last 3 Encounters:  01/03/22 118/69  09/27/21 132/74  07/04/21 (!) 120/55   Pulse Readings from Last 3 Encounters:  01/03/22 85  09/27/21 80  07/04/21 91   Wt Readings from Last 3 Encounters:  01/03/22 189 lb (85.7 kg)  09/27/21 194 lb (88 kg)  06/21/21 192 lb (87.1 kg)    Assessment: Review of patient past medical history, allergies, medications, health status, including review of consultants reports, laboratory and other test data, was performed as part of comprehensive evaluation and provision of chronic care management services.   SDOH:  (Social Determinants of Health) assessments and interventions performed:  SDOH Interventions    Flowsheet Row Office Visit from 11/11/2019 in [facility] Office Visit from 09/10/2018 in [facility]  SDOH Interventions    Depression Interventions/Treatment  Counseling Counseling       CCM Care  Plan  Allergies  Allergen Reactions   Farxiga [Dapagliflozin]     Recurrent yeast infections.    Penicillins     Medications Reviewed Today     Reviewed by Nolene Ebbs (Physician Assistant) on 01/03/22 at [phone]  Med List Status: <None>   Medication Order Taking? Sig Documenting Provider Last Dose Status Informant  AMBULATORY NON FORMULARY MEDICATION [id number] Yes Glucometer lancets and test strips for DM to check 1-2 times a day. Jomarie Longs, PA-C Taking Active   Ascorbic Acid (VITAMIN C PO) [id number] Yes Take by mouth. [provider] Taking Active   aspirin 81 MG tablet [id number] Yes Take 81 mg by mouth daily. [provider] Taking Active   Cholecalciferol (VITAMIN D PO) [id number] Yes Take by mouth. [provider] Taking Active   Ferrous Sulfate (IRON PO) [id number] Yes Take 1 tablet by mouth 3 (three) times a week. [provider] Taking Active   lisinopril (ZESTRIL) 5 MG tablet [id number] Yes Take 1 tablet (5 mg total) by mouth daily. Jomarie Longs, PA-C Taking Active   metFORMIN (GLUCOPHAGE) 1000 MG tablet [id number] Yes Take 1 tablet (1,000 mg total) by mouth 2 (two) times daily with a meal. [name], [name], PA-C Taking Active   pioglitazone (ACTOS) 45 MG tablet [id number] Yes TAKE 1 TABLET(45 MG) BY MOUTH DAILY [name], [name], PA-C Taking Active   rosuvastatin (CRESTOR) 20 MG tablet [id number] Yes Take 1 tablet (20 mg total) by mouth daily.  Patient taking differently: Take 20 mg by mouth. Three times a week   [name], [name], PA-C Taking Active   Semaglutide (RYBELSUS) 7 MG TABS [id number] Yes Take by mouth. [provider] Taking Active             Patient Active Problem List   Diagnosis Date Noted   Low serum iron 03/20/2021   Asymptomatic varicose veins of right lower extremity 03/20/2021   Foot callus 03/20/2021   Type II diabetes mellitus with complication, uncontrolled 06/01/2020   IDA (iron deficiency  anemia) 11/13/2019   Chronic right shoulder pain 08/03/2019   History of COVID-19 05/05/2019   Swelling of left index finger 06/11/2018   Breast cyst, left 04/11/2018   Moderate aortic valve stenosis 04/11/2018   Bicuspid aortic valve 04/11/2018   Carotid artery stenosis, asymptomatic, right 04/11/2018   Liver cyst 04/08/2018   Elevated liver enzymes 07/15/2017   Systolic ejection murmur 07/15/2017   Left carotid bruit 07/15/2017   Hyperlipidemia associated with type 2 diabetes mellitus (HCC) 04/14/2016   Class 1 obesity  due to excess calories with serious comorbidity and body mass index (BMI) of 32.0 to 32.9 in adult 01/04/2016   Dyslipidemia, goal LDL below 70 10/04/2015   Diabetes mellitus (HCC) 10/04/2015   Essential hypertension, benign 10/04/2015    Immunization History  Administered Date(s) Administered   PNEUMOCOCCAL CONJUGATE-20 07/04/2021   Tdap 06/21/2021    Conditions to be addressed/monitored: HTN, HLD, and DMII   Medication Assistance: Application for rybelsus (novonordisk)  medication assistance program. in process.  Anticipated assistance start date 11/04/20.  See plan of care for additional detail.  Patient's preferred pharmacy is:  [id number] - [place], [place] - [address] AT [facility] [address] [place] [address] Phone: [phone] Fax: [phone]    Follow Up:  Patient agrees to Care Plan and Follow-up.  Plan: Telephone follow up appointment with care management team member scheduled for:  6-12 months   Lynnda Shields, PharmD Clinical Pharmacist [facility] [phone]

## 2022-04-04 ENCOUNTER — Encounter: Payer: Self-pay | Admitting: Physician Assistant

## 2022-04-04 ENCOUNTER — Ambulatory Visit (INDEPENDENT_AMBULATORY_CARE_PROVIDER_SITE_OTHER): Payer: Medicare Other | Admitting: Physician Assistant

## 2022-04-04 VITALS — BP 137/62 | HR 85 | Ht 65.0 in | Wt 188.0 lb

## 2022-04-04 DIAGNOSIS — Z1329 Encounter for screening for other suspected endocrine disorder: Secondary | ICD-10-CM

## 2022-04-04 DIAGNOSIS — E1169 Type 2 diabetes mellitus with other specified complication: Secondary | ICD-10-CM | POA: Diagnosis not present

## 2022-04-04 DIAGNOSIS — E785 Hyperlipidemia, unspecified: Secondary | ICD-10-CM

## 2022-04-04 DIAGNOSIS — I6521 Occlusion and stenosis of right carotid artery: Secondary | ICD-10-CM

## 2022-04-04 DIAGNOSIS — I10 Essential (primary) hypertension: Secondary | ICD-10-CM

## 2022-04-04 DIAGNOSIS — E611 Iron deficiency: Secondary | ICD-10-CM

## 2022-04-04 LAB — POCT GLYCOSYLATED HEMOGLOBIN (HGB A1C): Hemoglobin A1C: 7.1 % — AB (ref 4.0–5.6)

## 2022-04-04 MED ORDER — METFORMIN HCL 1000 MG PO TABS: 1000.0000 mg | ORAL_TABLET | Freq: Two times a day (BID) | ORAL | 1 refills | Status: DC

## 2022-04-04 NOTE — Progress Notes (Signed)
Established Patient Office Visit  Subjective   Patient ID: Connie Cardenas, female    DOB: [birth year]  Age: 71 y.o. MRN: [id number]  Chief Complaint  Patient presents with   Follow-up   Diabetes    HPI Pt is a 71 yo female with T2DM, aortic stenosis, HLD, bilateral cartoid artery stenosis who presents to the clinic for 3 month follow up.   She is checking her sugars some and ranging 90-150 in morning. It has gone up some since not having rybelsus for a few weeks. She gets hers from [place] when her husband goes. She is on metformin, Actos, rybelsus. She is active but no exercise. She denies any CP, palpitations, headaches.   She recently had follow up with vascular with echo. No concerns.   .. Active Ambulatory Problems    Diagnosis Date Noted   Dyslipidemia, goal LDL below 70 10/04/2015   Diabetes mellitus (HCC) 10/04/2015   Essential hypertension, benign 10/04/2015   Class 1 obesity due to excess calories with serious comorbidity and body mass index (BMI) of 32.0 to 32.9 in adult 01/04/2016   Hyperlipidemia associated with type 2 diabetes mellitus (HCC) 04/14/2016   Elevated liver enzymes 07/15/2017   Systolic ejection murmur 07/15/2017   Left carotid bruit 07/15/2017   Liver cyst 04/08/2018   Breast cyst, left 04/11/2018   Moderate aortic valve stenosis 04/11/2018   Bicuspid aortic valve 04/11/2018   Carotid artery stenosis, asymptomatic, right 04/11/2018   Swelling of left index finger 06/11/2018   History of COVID-19 05/05/2019   Chronic right shoulder pain 08/03/2019   IDA (iron deficiency anemia) 11/13/2019   Type II diabetes mellitus with complication, uncontrolled 06/01/2020   Low serum iron 03/20/2021   Asymptomatic varicose veins of right lower extremity 03/20/2021   Foot callus 03/20/2021   Resolved Ambulatory Problems    Diagnosis Date Noted   COVID-19 04/22/2019   Past Medical History:  Diagnosis Date   Diabetes mellitus without complication  (HCC)    Hypertension      Review of Systems  All other systems reviewed and are negative.     Objective:     BP 137/62   Pulse 85   Ht 5\' 5"  (1.651 m)   Wt 188 lb (85.3 kg)   SpO2 100%   BMI 31.28 kg/m  BP Readings from Last 3 Encounters:  04/04/22 137/62  01/03/22 118/69  09/27/21 132/74   Wt Readings from Last 3 Encounters:  04/04/22 188 lb (85.3 kg)  01/03/22 189 lb (85.7 kg)  09/27/21 194 lb (88 kg)      Physical Exam Vitals reviewed.  Constitutional:      Appearance: Normal appearance.  HENT:     Head: Normocephalic.  Cardiovascular:     Rate and Rhythm: Normal rate and regular rhythm.     Heart sounds: Murmur heard.  Pulmonary:     Effort: Pulmonary effort is normal.     Breath sounds: Normal breath sounds.  Musculoskeletal:     Cervical back: Normal range of motion.     Right lower leg: No edema.     Left lower leg: No edema.  Neurological:     General: No focal deficit present.     Mental Status: [name], place, and time.  Psychiatric:        Mood and Affect: Mood normal.      Results for orders placed or performed in visit on 04/04/22  POCT glycosylated hemoglobin (  Hb A1C)  Result Value Ref Range   Hemoglobin A1C 7.1 (A) 4.0 - 5.6 %   HbA1c POC (<> result, manual entry)     HbA1c, POC (prediabetic range)     HbA1c, POC (controlled diabetic range)       The 10-year ASCVD risk score (Arnett DK, et al., 2019) is: 18.9%    Assessment & Plan:  Marland KitchenMarland KitchenMakeshia was seen today for follow-up and diabetes.  Diagnoses and all orders for this visit:  Type 2 diabetes mellitus with other specified complication, without long-term current use of insulin (HCC) -     POCT glycosylated hemoglobin (Hb A1C) -     COMPLETE METABOLIC PANEL WITH GFR -     metFORMIN (GLUCOPHAGE) 1000 MG tablet; Take 1 tablet (1,000 mg total) by mouth 2 (two) times daily with a meal.  Essential hypertension, benign -     COMPLETE METABOLIC PANEL  WITH GFR  Dyslipidemia, goal LDL below 70 -     Lipid Panel w/reflex Direct LDL  Low serum iron -     CBC with Differential/Platelet -     Fe+TIBC+Fer  Thyroid disorder screen -     TSH   A1C not to goal.  Discussed medication cost and switching to medicare advantage plan Will get her number to agent Continue actos and metformin On statin, recheck lipid level .Marland Kitchen Diabetic Foot Exam - Simple   Simple Foot Form Diabetic Foot exam was performed with the following findings: Yes 04/04/2022  7:06 AM  Visual Inspection No deformities, no ulcerations, no other skin breakdown bilaterally: Yes Sensation Testing Intact to touch and monofilament testing bilaterally: Yes See comments: Yes Pulse Check Posterior Tibialis and Dorsalis pulse intact bilaterally: Yes Comments No sensation to monofilament testing bilateral heels    No sensation over heels but they are callused.  Eye exam scheduled.  Declined flu/shingles/covid/pneumonia vaccine.  Follow up in 3 months.     Return in about 3 months (around 07/04/2022).    [name], PA-C

## 2022-04-04 NOTE — Patient Instructions (Signed)
Medicare Advantage- Health Team Advantage Plan

## 2022-04-20 LAB — CBC WITH DIFFERENTIAL/PLATELET
Absolute Monocytes: 341 cells/uL (ref 200–950)
Basophils Absolute: 33 cells/uL (ref 0–200)
Basophils Relative: 0.6 %
Eosinophils Absolute: 154 cells/uL (ref 15–500)
Eosinophils Relative: 2.8 %
HCT: 35.4 % (ref 35.0–45.0)
Hemoglobin: 11.8 g/dL (ref 11.7–15.5)
Lymphs Abs: 1694 cells/uL (ref 850–3900)
MCH: 29.7 pg (ref 27.0–33.0)
MCHC: 33.3 g/dL (ref 32.0–36.0)
MCV: 89.2 fL (ref 80.0–100.0)
MPV: 10.3 fL (ref 7.5–12.5)
Monocytes Relative: 6.2 %
Neutro Abs: 3278 cells/uL (ref 1500–7800)
Neutrophils Relative %: 59.6 %
Platelets: 258 10*3/uL (ref 140–400)
RBC: 3.97 10*6/uL (ref 3.80–5.10)
RDW: 12.7 % (ref 11.0–15.0)
Total Lymphocyte: 30.8 %
WBC: 5.5 10*3/uL (ref 3.8–10.8)

## 2022-04-20 LAB — COMPLETE METABOLIC PANEL WITH GFR
AG Ratio: 1.6 (calc) (ref 1.0–2.5)
ALT: 11 U/L (ref 6–29)
AST: 18 U/L (ref 10–35)
Albumin: 4.2 g/dL (ref 3.6–5.1)
Alkaline phosphatase (APISO): 73 U/L (ref 37–153)
BUN: 12 mg/dL (ref 7–25)
CO2: 27 mmol/L (ref 20–32)
Calcium: 9.2 mg/dL (ref 8.6–10.4)
Chloride: 101 mmol/L (ref 98–110)
Creat: 0.73 mg/dL (ref 0.50–1.05)
Globulin: 2.6 g/dL (calc) (ref 1.9–3.7)
Glucose, Bld: 117 mg/dL — ABNORMAL HIGH (ref 65–99)
Potassium: 4 mmol/L (ref 3.5–5.3)
Sodium: 139 mmol/L (ref 135–146)
Total Bilirubin: 0.4 mg/dL (ref 0.2–1.2)
Total Protein: 6.8 g/dL (ref 6.1–8.1)
eGFR: 89 mL/min/{1.73_m2} (ref >=60)

## 2022-04-20 LAB — LIPID PANEL W/REFLEX DIRECT LDL
Cholesterol: 153 mg/dL (ref <200)
HDL: 62 mg/dL (ref >=50)
LDL Cholesterol (Calc): 75 mg/dL (calc)
Non-HDL Cholesterol (Calc): 91 mg/dL (calc) (ref <130)
Total CHOL/HDL Ratio: 2.5 (calc) (ref <5.0)
Triglycerides: 80 mg/dL (ref <150)

## 2022-04-20 LAB — IRON,TIBC AND FERRITIN PANEL
%SAT: 14 % (calc) — ABNORMAL LOW (ref 16–45)
Ferritin: 13 ng/mL — ABNORMAL LOW (ref 16–288)
Iron: 50 ug/dL (ref 45–160)
TIBC: 358 mcg/dL (calc) (ref 250–450)

## 2022-04-20 LAB — TSH: TSH: 1.45 mIU/L (ref 0.40–4.50)

## 2022-04-20 NOTE — Progress Notes (Signed)
Labs looks good.  Your iron stores continue to be low. If you are taking 3 times a week if you can tolerate would but good to increase to at least daily.

## 2022-05-23 ENCOUNTER — Other Ambulatory Visit: Payer: Self-pay | Admitting: Physician Assistant

## 2022-05-23 DIAGNOSIS — I10 Essential (primary) hypertension: Secondary | ICD-10-CM

## 2022-05-23 DIAGNOSIS — E119 Type 2 diabetes mellitus without complications: Secondary | ICD-10-CM

## 2022-06-27 ENCOUNTER — Ambulatory Visit (INDEPENDENT_AMBULATORY_CARE_PROVIDER_SITE_OTHER): Payer: BLUE CROSS/BLUE SHIELD | Admitting: Physician Assistant

## 2022-06-27 VITALS — BP 123/55 | HR 76 | Ht 65.0 in | Wt 188.0 lb

## 2022-06-27 DIAGNOSIS — E1169 Type 2 diabetes mellitus with other specified complication: Secondary | ICD-10-CM

## 2022-06-27 DIAGNOSIS — E785 Hyperlipidemia, unspecified: Secondary | ICD-10-CM | POA: Diagnosis not present

## 2022-06-27 DIAGNOSIS — I10 Essential (primary) hypertension: Secondary | ICD-10-CM

## 2022-06-27 DIAGNOSIS — I350 Nonrheumatic aortic (valve) stenosis: Secondary | ICD-10-CM

## 2022-06-27 LAB — POCT GLYCOSYLATED HEMOGLOBIN (HGB A1C): Hemoglobin A1C: 6.6 % — AB (ref 4.0–5.6)

## 2022-06-27 MED ORDER — TRULICITY 0.75 MG/0.5ML ~~LOC~~ SOAJ: 0.7500 mg | SUBCUTANEOUS | 2 refills | Status: DC

## 2022-06-27 NOTE — Progress Notes (Signed)
Established Patient Office Visit  Subjective   Patient ID: Connie Cardenas, female    DOB: [birth year]  Age: 72 y.o. MRN: [id number]  Chief Complaint  Patient presents with   Follow-up   Diabetes    HPI Pt is a 72 yo obese female with T2DM, HTN, HLD, AS, carotid bruit who presents to the clinic for follow up.   Pt is doing well. She is not on rybelsus due to cost. She denies any hypoglycemic events.  Open sores or wounds.  She is not checking her sugars regularly.  She is staying active.  She denies any chest pain, palpitations, headaches or vision changes.  She has not noticed any changes in her shortness of breath or lower extremity edema.  She is worried about her sugars increasing not being on Rybelsus.   Patient Active Problem List   Diagnosis Date Noted   Low serum iron 03/20/2021   Asymptomatic varicose veins of right lower extremity 03/20/2021   Foot callus 03/20/2021   Type II diabetes mellitus with complication, uncontrolled 06/01/2020   IDA (iron deficiency anemia) 11/13/2019   Chronic right shoulder pain 08/03/2019   History of COVID-19 05/05/2019   Swelling of left index finger 06/11/2018   Breast cyst, left 04/11/2018   Moderate aortic valve stenosis 04/11/2018   Bicuspid aortic valve 04/11/2018   Carotid artery stenosis, asymptomatic, right 04/11/2018   Liver cyst 04/08/2018   Elevated liver enzymes 73/71/0626   Systolic ejection murmur 94/85/4627   Left carotid bruit 07/15/2017   Hyperlipidemia associated with type 2 diabetes mellitus ([place]) 04/14/2016   Class 1 obesity due to excess calories with serious comorbidity and body mass index (BMI) of 32.0 to 32.9 in adult 01/04/2016   Dyslipidemia, goal LDL below 70 10/04/2015   Diabetes mellitus ([place]) 10/04/2015   Essential hypertension, benign 10/04/2015   Past Medical History:  Diagnosis Date   Diabetes mellitus without complication (HCC)    Hypertension    Past Surgical History:  Procedure  Laterality Date   TUBAL LIGATION     Social History   Tobacco Use   Smoking status: Never   Smokeless tobacco: Never  Vaping Use   Vaping Use: Never used  Substance Use Topics   Alcohol use: No    Alcohol/week: 0.0 standard drinks of alcohol   Drug use: No   Social History   Socioeconomic History   Marital status: Married    Spouse name: Not on file   Number of children: Not on file   Years of education: Not on file   Highest education level: Not on file  Occupational History   Not on file  Tobacco Use   Smoking status: Never   Smokeless tobacco: Never  Vaping Use   Vaping Use: Never used  Substance and Sexual Activity   Alcohol use: No    Alcohol/week: 0.0 standard drinks of alcohol   Drug use: No   Sexual activity: Not Currently    Birth control/protection: Surgical  Other Topics Concern   Not on file  Social History Narrative   Not on file   Social Determinants of Health   Financial Resource Strain: Not on file  Food Insecurity: Not on file  Transportation Needs: Not on file  Physical Activity: Not on file  Stress: Not on file  Social Connections: Not on file  Intimate Partner Violence: Not on file   Family Status  Relation Name Status   Sister  Deceased   Mother  (Not Specified)  Father  (Not Specified)   PGM  (Not Specified)   Family History  Problem Relation Age of Onset   Cancer Sister        non-hodkins lymphoma   Cancer Mother        colon   Hypertension Mother    Heart attack Father    Hyperlipidemia Father    Stroke Paternal Grandmother    Allergies  Allergen Reactions   Farxiga [Dapagliflozin]     Recurrent yeast infections.    Penicillins     ROS See HPI.    Objective:     BP (!) 123/55   Pulse 76   Ht 5\' 5"  (2.353 m)   Wt 188 lb (85.3 kg)   SpO2 100%   BMI 31.28 kg/m  BP Readings from Last 3 Encounters:  06/27/22 (!) 123/55  04/04/22 137/62  01/03/22 118/69   Wt Readings from Last 3 Encounters:  06/27/22 188  lb (85.3 kg)  04/04/22 188 lb (85.3 kg)  01/03/22 189 lb (85.7 kg)    ..    04/04/2022    7:06 AM 01/03/2022    7:22 AM 09/27/2021    7:22 AM 06/21/2021    7:34 AM 03/20/2021    9:56 AM  Depression screen PHQ 2/9  Decreased Interest 0 0 0 0 0  Down, Depressed, Hopeless 0 0 0 0 0  PHQ - 2 Score 0 0 0 0 0     Physical Exam Constitutional:      Appearance: Normal appearance. She is obese.  HENT:     Head: Normocephalic.  Neck:     Vascular: Carotid bruit present.  Cardiovascular:     Rate and Rhythm: Normal rate and regular rhythm.     Pulses: Normal pulses.     Heart sounds: Murmur heard.  Pulmonary:     Effort: Pulmonary effort is normal.     Breath sounds: Normal breath sounds.  Musculoskeletal:     Cervical back: No tenderness.     Right lower leg: No edema.     Left lower leg: No edema.  Lymphadenopathy:     Cervical: No cervical adenopathy.  Neurological:     General: No focal deficit present.     Mental Status: [name], place, and time.  Psychiatric:        Mood and Affect: Mood normal.      Results for orders placed or performed in visit on 06/27/22  POCT glycosylated hemoglobin (Hb A1C)  Result Value Ref Range   Hemoglobin A1C 6.6 (A) 4.0 - 5.6 %   HbA1c POC (<> result, manual entry)     HbA1c, POC (prediabetic range)     HbA1c, POC (controlled diabetic range)         The 10-year ASCVD risk score (Arnett DK, et al., 2019) is: 17.3%    Assessment & Plan:  Marland KitchenMarland KitchenNakeita was seen today for follow-up and diabetes.  Diagnoses and all orders for this visit:  Type 2 diabetes mellitus with other specified complication, without long-term current use of insulin (HCC) -     POCT glycosylated hemoglobin (Hb A1C) -     Dulaglutide (TRULICITY) 6.14 ER/1.5QM SOPN; Inject 0.75 mg into the skin once a week.  Essential hypertension, benign  Moderate aortic valve stenosis  Hyperlipidemia associated with type 2 diabetes mellitus  ([place])  Other orders -     Discontinue: Dulaglutide (TRULICITY) 0.86 PY/1.9JK SOPN; Inject 0.75 mg into the skin once a  week.   A1C under 7 to goal but fear after 3 months will be above 7 Consider trulicity which appears to covered by insurance BP to goal Taking crestor 3 times a day Foot exam UTd.  Needs eye exam Pneumonia UTD Declines covid and flu shot Follow up in 3 months  Aortic valve stenosis follows up with vascular yearly  Consider scheduling MWV.   Follow up in 3 months.     Iran Planas, PA-C

## 2022-06-27 NOTE — Patient Instructions (Signed)
Consider trulicity

## 2022-07-02 ENCOUNTER — Encounter: Payer: Self-pay | Admitting: Physician Assistant

## 2022-07-08 IMAGING — MG MM DIGITAL SCREENING BILAT W/ TOMO AND CAD
8 series · 8 of 24 positions shown · non-contrast
Comparison: Previous exam(s).

CLINICAL DATA: Screening.

EXAM:
DIGITAL SCREENING BILATERAL MAMMOGRAM WITH TOMOSYNTHESIS AND CAD
TECHNIQUE: Bilateral screening digital craniocaudal and mediolateral oblique
mammograms were obtained. Bilateral screening digital breast
tomosynthesis was performed. The images were evaluated with
computer-aided detection.

[R MLO synth-2D]
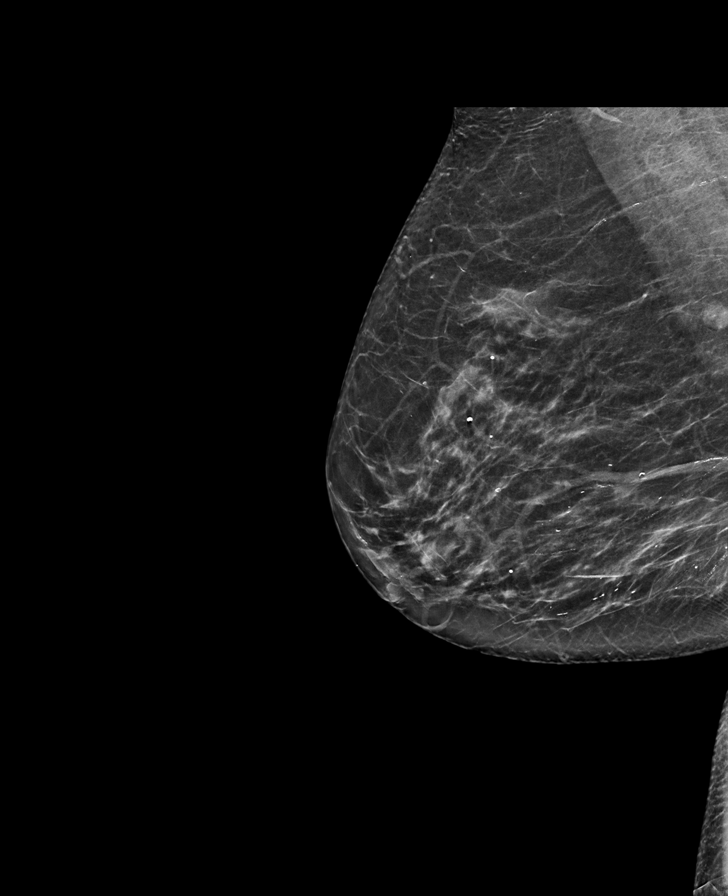

[L CC synth-2D]
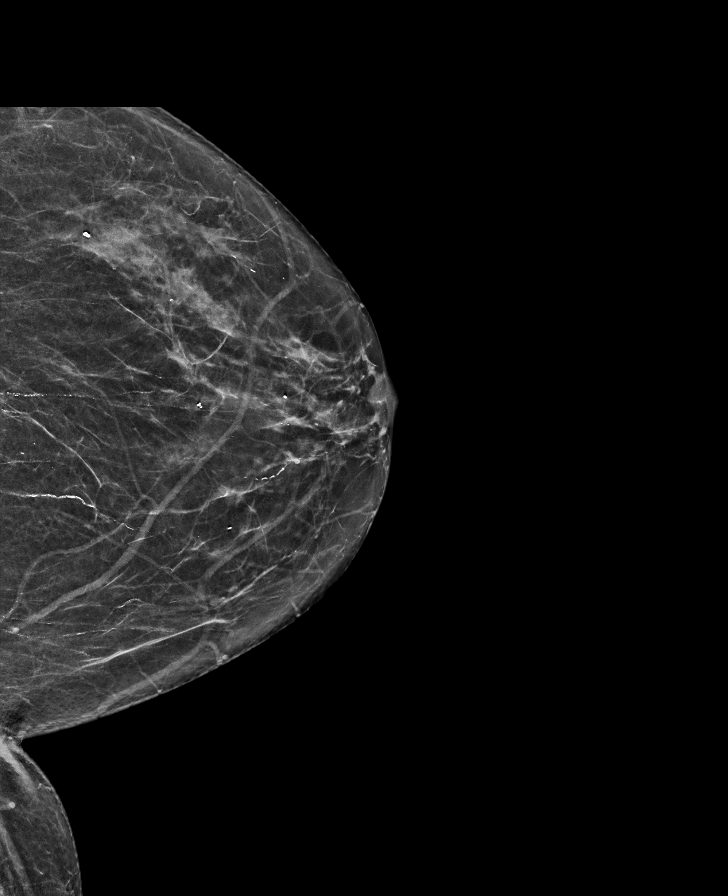

[L MLO synth-2D]
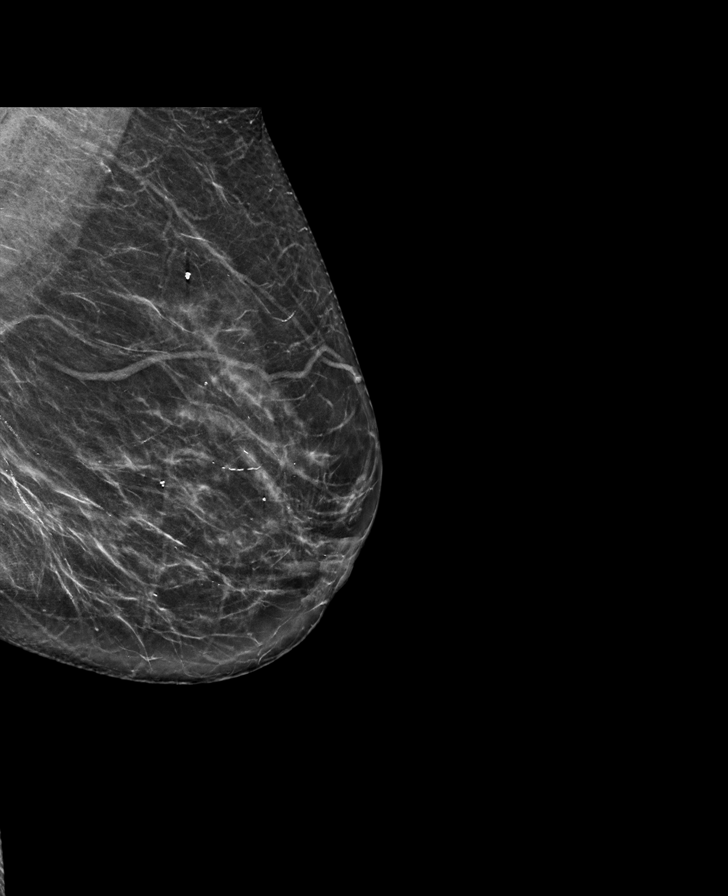

[R CC synth-2D]
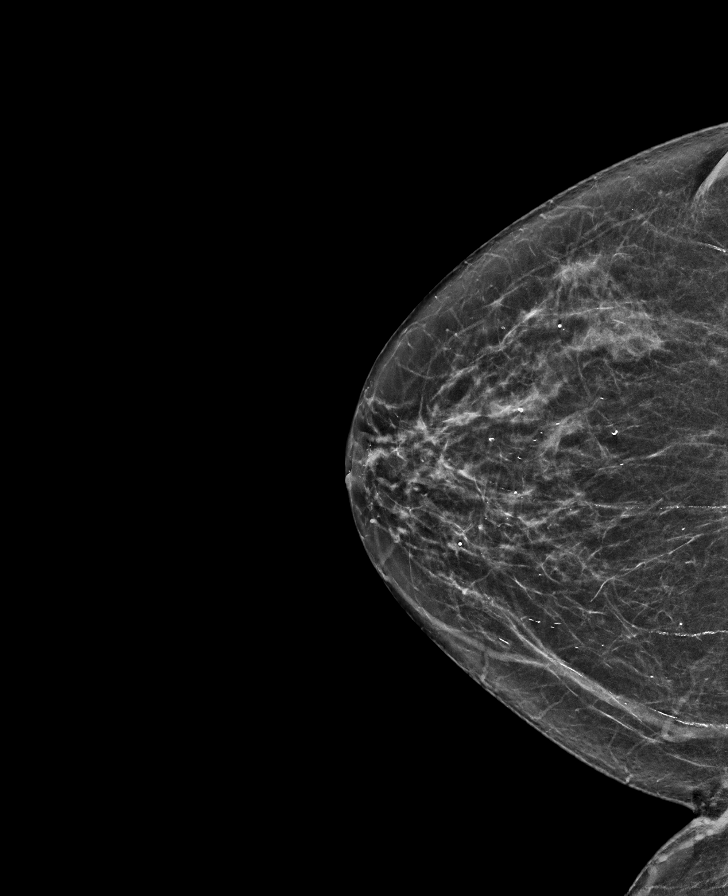

[L MLO tomo · tomo slice 35/70.0]
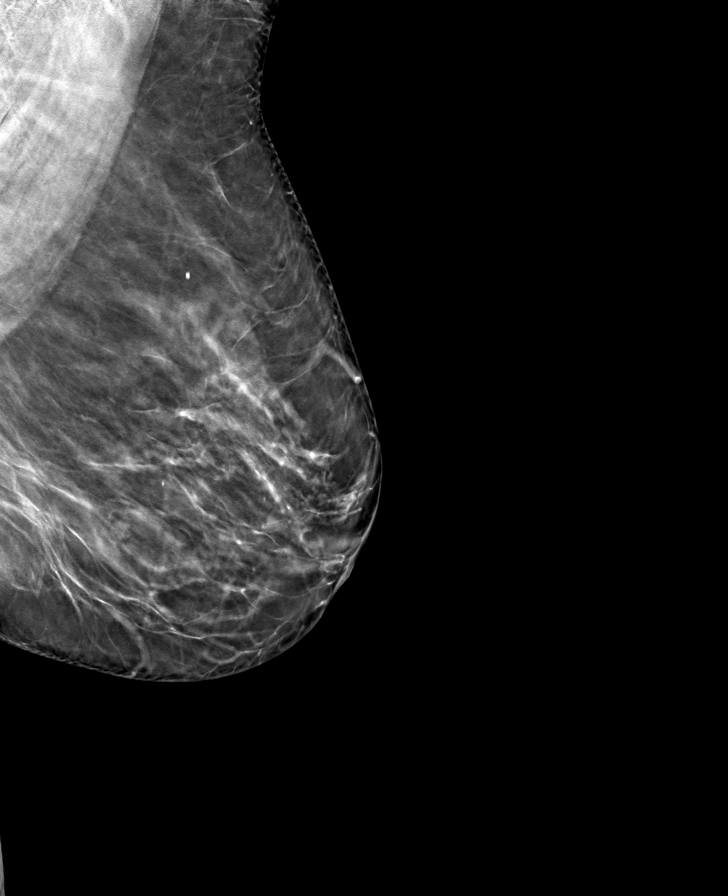

[R MLO tomo · tomo slice 35/68.0]
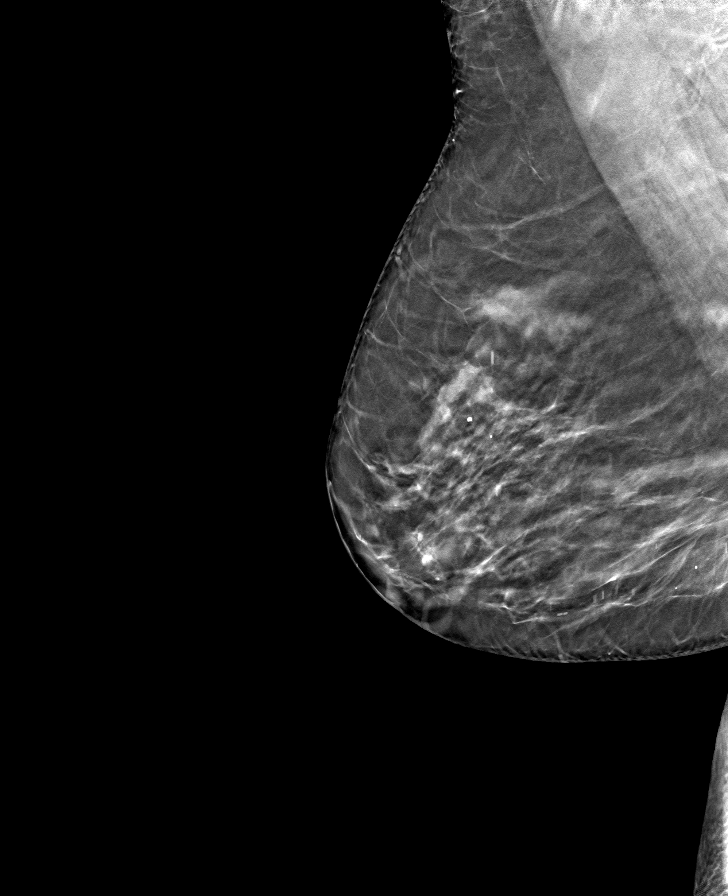

[L CC tomo · tomo slice 31/61.0]
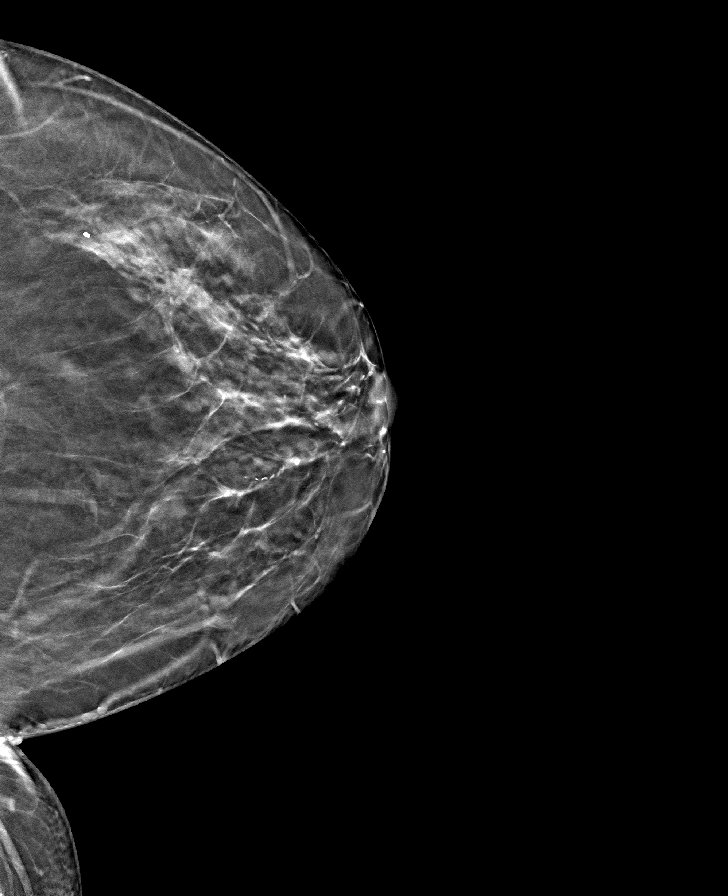

[R CC tomo · tomo slice 32/63.0]
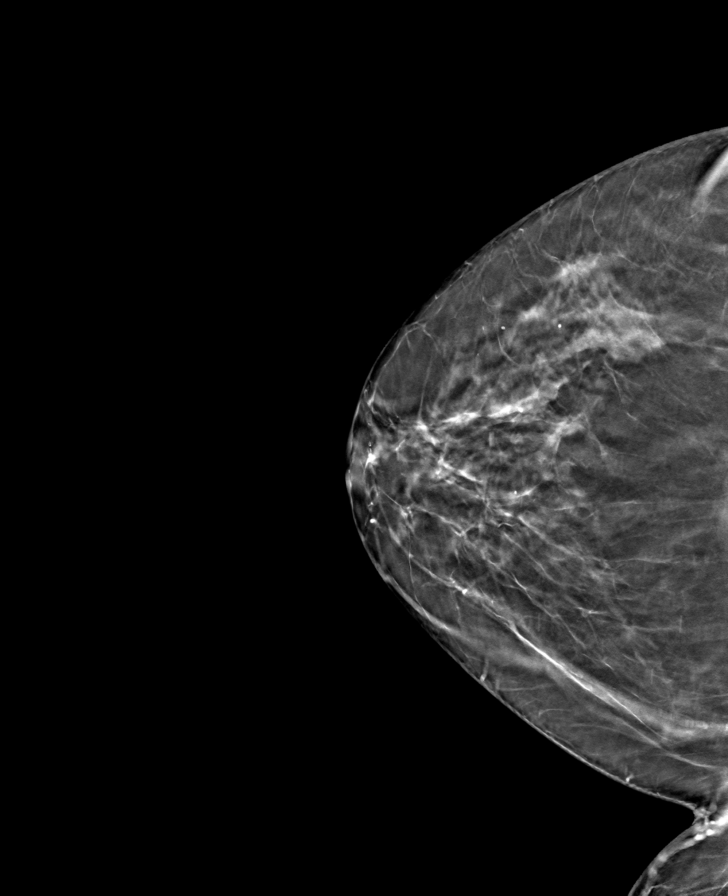

[8 of 24 positions shown; findings below may reference images not displayed]

ACR Breast Density Category b: There are scattered areas of
fibroglandular density.
FINDINGS: There are no findings suspicious for malignancy.
IMPRESSION: No mammographic evidence of malignancy. A result letter of this
screening mammogram will be mailed directly to the patient.

RECOMMENDATION:
Screening mammogram in one year. (Code:51-O-LD2)

BI-RADS CATEGORY  1: Negative.

## 2022-07-30 ENCOUNTER — Telehealth: Payer: Self-pay | Admitting: Physician Assistant

## 2022-07-30 NOTE — Telephone Encounter (Signed)
Contacted Connie Cardenas to schedule their annual wellness visit. Appointment made for 08/21/2022 at 2pm.  Connie Cardenas

## 2022-08-21 ENCOUNTER — Ambulatory Visit (INDEPENDENT_AMBULATORY_CARE_PROVIDER_SITE_OTHER): Payer: Medicare Other | Admitting: Family Medicine

## 2022-08-21 DIAGNOSIS — Z0000 Encounter for general adult medical examination without abnormal findings: Secondary | ICD-10-CM | POA: Diagnosis not present

## 2022-08-21 NOTE — Progress Notes (Signed)
MEDICARE ANNUAL WELLNESS VISIT  08/21/2022  Telephone Visit Disclaimer This Medicare AWV was conducted by telephone due to national recommendations for restrictions regarding the COVID-19 Pandemic (e.g. social distancing).  I verified, using two identifiers, that I am speaking with Joneen Boers or their authorized healthcare agent. I discussed the limitations, risks, security, and privacy concerns of performing an evaluation and management service by telephone and the potential availability of an in-person appointment in the future. The patient expressed understanding and agreed to proceed.  Location of Patient: Home Location of Provider (nurse):  In the office.  Subjective:    Ethel Meisenheimer is a 72 y.o. female patient of Jomarie Longs, PA-C who had a Medicare Annual Wellness Visit today via telephone. Jaymi is Retired and lives with their spouse. she has 5 children. she reports that she is socially active and does interact with friends/family regularly. she is moderately physically active and enjoys crafting and reading.  Patient Care Team: Nolene Ebbs as PCP - General (Family Medicine)     08/21/2022    1:59 PM 10/04/2015    1:28 PM  Advanced Directives  Does Patient Have a Medical Advance Directive? No No  Would patient like information on creating a medical advance directive? No - Patient declined Yes - Educational materials given    Hospital Utilization Over the Past 12 Months: # of hospitalizations or ER visits: 0 # of surgeries: 0  Review of Systems    Patient reports that her overall health is better compared to last year.  History obtained from chart review and the patient  Patient Reported Readings (BP, Pulse, CBG, Weight, etc) none  Pain Assessment Pain : No/denies pain     Current Medications & Allergies (verified) Allergies as of 08/21/2022       Reactions   Farxiga [dapagliflozin]    Recurrent yeast infections.     Penicillins         Medication List        Accurate as of August 21, 2022  2:12 PM. If you have any questions, ask your nurse or doctor.          AMBULATORY NON FORMULARY MEDICATION Glucometer lancets and test strips for DM to check 1-2 times a day.   aspirin 81 MG tablet Take 81 mg by mouth daily.   IRON PO Take 1 tablet by mouth 3 (three) times a week.   lisinopril 5 MG tablet Commonly known as: ZESTRIL TAKE 1 TABLET(5 MG) BY MOUTH DAILY   metFORMIN 1000 MG tablet Commonly known as: GLUCOPHAGE Take 1 tablet (1,000 mg total) by mouth 2 (two) times daily with a meal.   pioglitazone 45 MG tablet Commonly known as: ACTOS TAKE 1 TABLET(45 MG) BY MOUTH DAILY   rosuvastatin 20 MG tablet Commonly known as: Crestor Take 1 tablet (20 mg total) by mouth daily. What changed:  when to take this additional instructions   Trulicity 0.75 MG/0.5ML Sopn Generic drug: Dulaglutide Inject 0.75 mg into the skin once a week.   VITAMIN C PO Take by mouth.   VITAMIN D PO Take by mouth.        History (reviewed): Past Medical History:  Diagnosis Date   Diabetes mellitus without complication (HCC)    Hypertension    Past Surgical History:  Procedure Laterality Date   TUBAL LIGATION     Family History  Problem Relation Age of Onset   Cancer Sister  non-hodkins lymphoma   Cancer Mother        colon   Hypertension Mother    Heart attack Father    Hyperlipidemia Father    Stroke Paternal Grandmother    Social History   Socioeconomic History   Marital status: Married    Spouse name: Griffin Basil   Number of children: 5   Years of education: 16   Highest education level: Bachelor's degree (e.g., BA, AB, BS)  Occupational History   Occupation: Retired  Tobacco Use   Smoking status: Never   Smokeless tobacco: Never  Vaping Use   Vaping Use: Never used  Substance and Sexual Activity   Alcohol use: No    Alcohol/week: 0.0 standard drinks of alcohol    Drug use: No   Sexual activity: Not Currently    Birth control/protection: Surgical  Other Topics Concern   Not on file  Social History Narrative   Lives with her spouse. She has five children. She enjoys crafting and reading.   Social Determinants of Health   Financial Resource Strain: Low Risk  (08/21/2022)   Overall Financial Resource Strain (CARDIA)    Difficulty of Paying Living Expenses: Not hard at all  Food Insecurity: No Food Insecurity (08/21/2022)   Hunger Vital Sign    Worried About Running Out of Food in the Last Year: Never true    Ran Out of Food in the Last Year: Never true  Transportation Needs: No Transportation Needs (08/21/2022)   PRAPARE - Administrator, Civil Service (Medical): No    Lack of Transportation (Non-Medical): No  Physical Activity: Inactive (08/21/2022)   Exercise Vital Sign    Days of Exercise per Week: 0 days    Minutes of Exercise per Session: 0 min  Stress: Not on file  Social Connections: Moderately Integrated (08/21/2022)   Social Connection and Isolation Panel [NHANES]    Frequency of Communication with Friends and Family: More than three times a week    Frequency of Social Gatherings with Friends and Family: More than three times a week    Attends Religious Services: Never    Database administrator or Organizations: Yes    Attends Engineer, structural: More than 4 times per year    Marital Status: Married  Recent Concern: Social Connections - Moderately Isolated (08/21/2022)   Social Connection and Isolation Panel [NHANES]    Frequency of Communication with Friends and Family: More than three times a week    Frequency of Social Gatherings with Friends and Family: More than three times a week    Attends Religious Services: Never    Database administrator or Organizations: No    Attends Banker Meetings: Never    Marital Status: Married    Activities of Daily Living    08/21/2022    2:02 PM  In your  present state of health, do you have any difficulty performing the following activities:  Hearing? 0  Vision? 0  Difficulty concentrating or making decisions? 0  Walking or climbing stairs? 0  Dressing or bathing? 0  Doing errands, shopping? 0  Preparing Food and eating ? N  Using the Toilet? N  In the past six months, have you accidently leaked urine? Y  Do you have problems with loss of bowel control? N  Managing your Medications? N  Managing your Finances? N  Housekeeping or managing your Housekeeping? N    Patient Education/ Literacy How often do you  need to have someone help you when you read instructions, pamphlets, or other written materials from your doctor or pharmacy?: 1 - Never What is the last grade level you completed in school?: bachelor's degree  Exercise Current Exercise Habits: The patient does not participate in regular exercise at present, Exercise limited by: None identified  Diet Patient reports consuming 3 meals a day and 1 snack(s) a day Patient reports that her primary diet is: Regular Patient reports that she does have regular access to food.   Depression Screen    08/21/2022    1:59 PM 04/04/2022    7:06 AM 01/03/2022    7:22 AM 09/27/2021    7:22 AM 06/21/2021    7:34 AM 03/20/2021    9:56 AM 11/11/2019    7:53 AM  PHQ 2/9 Scores  PHQ - 2 Score 0 0 0 0 0 0 0  PHQ- 9 Score       2     Fall Risk    08/21/2022    1:59 PM 04/04/2022    7:06 AM 01/03/2022    7:22 AM 09/27/2021    7:22 AM 06/21/2021    7:34 AM  Fall Risk   Falls in the past year? 0 0 0 0 0  Number falls in past yr: 0 0 0 0 0  Injury with Fall? 0 0 0 0 0  Risk for fall due to : No Fall Risks No Fall Risks No Fall Risks No Fall Risks No Fall Risks  Follow up Falls evaluation completed Falls evaluation completed Falls evaluation completed Falls evaluation completed Falls evaluation completed     Objective:  Zipporah Finamore seemed alert and oriented and she participated  appropriately during our telephone visit.  Blood Pressure Weight BMI  BP Readings from Last 3 Encounters:  06/27/22 (!) 123/55  04/04/22 137/62  01/03/22 118/69   Wt Readings from Last 3 Encounters:  06/27/22 188 lb (85.3 kg)  04/04/22 188 lb (85.3 kg)  01/03/22 189 lb (85.7 kg)   BMI Readings from Last 1 Encounters:  06/27/22 31.28 kg/m    *Unable to obtain current vital signs, weight, and BMI due to telephone visit type  Hearing/Vision  Xitlally did not seem to have difficulty with hearing/understanding during the telephone conversation Reports that she has had a formal eye exam by an eye care professional within the past year Reports that she has not had a formal hearing evaluation within the past year *Unable to fully assess hearing and vision during telephone visit type  Cognitive Function:    08/21/2022    2:06 PM  6CIT Screen  What Year? 0 points  What month? 0 points  What time? 0 points  Count back from 20 0 points  Months in reverse 0 points  Repeat phrase 0 points  Total Score 0 points   (Normal:0-7, Significant for Dysfunction: >8)  Normal Cognitive Function Screening: Yes   Immunization & Health Maintenance Record Immunization History  Administered Date(s) Administered   PNEUMOCOCCAL CONJUGATE-20 07/04/2021   Tdap 06/21/2021    Health Maintenance  Topic Date Due   Zoster Vaccines- Shingrix (1 of 2) 09/27/2022 (Originally 04/11/1970)   INFLUENZA VACCINE  11/22/2022   HEMOGLOBIN A1C  12/28/2022   Diabetic kidney evaluation - Urine ACR  01/04/2023   FOOT EXAM  04/05/2023   Diabetic kidney evaluation - eGFR measurement  04/20/2023   OPHTHALMOLOGY EXAM  05/08/2023   Medicare Annual Wellness (AWV)  08/21/2023   MAMMOGRAM  10/03/2023  COLONOSCOPY (Pts 45-64yrs Insurance coverage will need to be confirmed)  06/27/2027   DTaP/Tdap/Td (2 - Td or Tdap) 06/22/2031   Pneumonia Vaccine 29+ Years old  Completed   DEXA SCAN  Completed   Hepatitis C  Screening  Completed   HPV VACCINES  Aged Out   COVID-19 Vaccine  Discontinued       Assessment  This is a routine wellness examination for [employer].  Health Maintenance: Due or Overdue There are no preventive care reminders to display for this patient.   Joneen Boers does not need a referral for [employer] Assistance: Care Management:   no Social Work:    no Prescription Assistance:  no Nutrition/Diabetes Education:  no   Plan:  Personalized Goals  Goals Addressed               This Visit's Progress     Patient Stated (pt-stated)        Patient stated that she would like to be more active and do more walking.       Personalized Health Maintenance & Screening Recommendations  Screening mammography Shingrix vaccine    Lung Cancer Screening Recommended: no (Low Dose CT Chest recommended if Age 69-80 years, 30 pack-year currently smoking OR have quit w/in past 15 years) Hepatitis C Screening recommended: no HIV Screening recommended: no  Advanced Directives: Written information was not prepared per patient's request.  Referrals & Orders No orders of the defined types were placed in this encounter.   Follow-up Plan Follow-up with Jomarie Longs, PA-C as planned Schedule shingrix vaccine at the pharmacy.  Medicare wellness visit in one year.  Patient will access AVS on my chart.   I have personally reviewed and noted the following in the patient's chart:   Medical and social history Use of alcohol, tobacco or illicit drugs  Current medications and supplements Functional ability and status Nutritional status Physical activity Advanced directives List of other physicians Hospitalizations, surgeries, and ER visits in previous 12 months Vitals Screenings to include cognitive, depression, and falls Referrals and appointments  In addition, I have reviewed and discussed with Joneen Boers certain preventive protocols,  quality metrics, and best practice recommendations. A written personalized care plan for preventive services as well as general preventive health recommendations is available and can be mailed to the patient at her request.      Modesto Charon, RN BSN  08/21/2022

## 2022-08-21 NOTE — Patient Instructions (Addendum)
MEDICARE ANNUAL WELLNESS VISIT Health Maintenance Summary and Written Plan of Care  Ms. Connie Cardenas ,  Thank you for allowing me to perform your Medicare Annual Wellness Visit and for your ongoing commitment to your health.   Health Maintenance & Immunization History Health Maintenance  Topic Date Due   Zoster Vaccines- Shingrix (1 of 2) 09/27/2022 (Originally 04/11/1970)   INFLUENZA VACCINE  11/22/2022   HEMOGLOBIN A1C  12/28/2022   Diabetic kidney evaluation - Urine ACR  01/04/2023   FOOT EXAM  04/05/2023   Diabetic kidney evaluation - eGFR measurement  04/20/2023   OPHTHALMOLOGY EXAM  05/08/2023   Medicare Annual Wellness (AWV)  08/21/2023   MAMMOGRAM  10/03/2023   COLONOSCOPY (Pts 45-63yrs Insurance coverage will need to be confirmed)  06/27/2027   DTaP/Tdap/Td (2 - Td or Tdap) 06/22/2031   Pneumonia Vaccine 62+ Years old  Completed   DEXA SCAN  Completed   Hepatitis C Screening  Completed   HPV VACCINES  Aged Out   COVID-19 Vaccine  Discontinued   Immunization History  Administered Date(s) Administered   PNEUMOCOCCAL CONJUGATE-20 07/04/2021   Tdap 06/21/2021    These are the patient goals that we discussed:  Goals Addressed               This Visit's Progress     Patient Stated (pt-stated)        Patient stated that she would like to be more active and do more walking.         This is a list of Health Maintenance Items that are overdue or due now:  Screening mammography - due in June Shingrix vaccine   Orders/Referrals Placed Today: No orders of the defined types were placed in this encounter.  (Contact our referral department at [phone] if you have not spoken with someone about your referral appointment within the next 5 days)    Follow-up Plan Follow-up with Connie Longs, PA-C as planned Schedule shingrix vaccine at the pharmacy.  Medicare wellness visit in one year.  Patient will access AVS on my chart.      Health Maintenance,  Female Adopting a healthy lifestyle and getting preventive care are important in promoting health and wellness. Ask your health care provider about: The right schedule for you to have regular tests and exams. Things you can do on your own to prevent diseases and keep yourself healthy. What should I know about diet, weight, and exercise? Eat a healthy diet  Eat a diet that includes plenty of vegetables, fruits, low-fat dairy products, and lean protein. Do not eat a lot of foods that are high in solid fats, added sugars, or sodium. Maintain a healthy weight Body mass index (BMI) is used to identify weight problems. It estimates body fat based on height and weight. Your health care provider can help determine your BMI and help you achieve or maintain a healthy weight. Get regular exercise Get regular exercise. This is one of the most important things you can do for your health. Most adults should: Exercise for at least 150 minutes each week. The exercise should increase your heart rate and make you sweat (moderate-intensity exercise). Do strengthening exercises at least twice a week. This is in addition to the moderate-intensity exercise. Spend less time sitting. Even light physical activity can be beneficial. Watch cholesterol and blood lipids Have your blood tested for lipids and cholesterol at 70 years of age, then have this test every 5 years. Have your cholesterol levels checked more  often if: Your lipid or cholesterol levels are high. You are older than 70 years of age. You are at high risk for heart disease. What should I know about cancer screening? Depending on your health history and family history, you may need to have cancer screening at various ages. This may include screening for: Breast cancer. Cervical cancer. Colorectal cancer. Skin cancer. Lung cancer. What should I know about heart disease, diabetes, and high blood pressure? Blood pressure and heart disease High blood  pressure causes heart disease and increases the risk of stroke. This is more likely to develop in people who have high blood pressure readings or are overweight. Have your blood pressure checked: Every 3-5 years if you are 80-3 years of age. Every year if you are 45 years old or older. Diabetes Have regular diabetes screenings. This checks your fasting blood sugar level. Have the screening done: Once every three years after age 58 if you are at a normal weight and have a low risk for diabetes. More often and at a younger age if you are overweight or have a high risk for diabetes. What should I know about preventing infection? Hepatitis B If you have a higher risk for hepatitis B, you should be screened for this virus. Talk with your health care provider to find out if you are at risk for hepatitis B infection. Hepatitis C Testing is recommended for: Everyone born from [birth year] through 1963. Anyone with known risk factors for hepatitis C. Sexually transmitted infections (STIs) Get screened for STIs, including gonorrhea and chlamydia, if: You are sexually active and are younger than 70 years of age. You are older than 70 years of age and your health care provider tells you that you are at risk for this type of infection. Your sexual activity has changed since you were last screened, and you are at increased risk for chlamydia or gonorrhea. Ask your health care provider if you are at risk. Ask your health care provider about whether you are at high risk for HIV. Your health care provider may recommend a prescription medicine to help prevent HIV infection. If you choose to take medicine to prevent HIV, you should first get tested for HIV. You should then be tested every 3 months for as long as you are taking the medicine. Pregnancy If you are about to stop having your period (premenopausal) and you may become pregnant, seek counseling before you get pregnant. Take 400 to 800 micrograms (mcg) of folic  acid every day if you become pregnant. Ask for birth control (contraception) if you want to prevent pregnancy. Osteoporosis and menopause Osteoporosis is a disease in which the bones lose minerals and strength with aging. This can result in bone fractures. If you are 71 years old or older, or if you are at risk for osteoporosis and fractures, ask your health care provider if you should: Be screened for bone loss. Take a calcium or vitamin D supplement to lower your risk of fractures. Be given hormone replacement therapy (HRT) to treat symptoms of menopause. Follow these instructions at home: Alcohol use Do not drink alcohol if: Your health care provider tells you not to drink. You are pregnant, may be pregnant, or are planning to become pregnant. If you drink alcohol: Limit how much you have to: 0-1 drink a day. Know how much alcohol is in your drink. In the [place] one drink equals one 12 oz bottle of beer (355 mL), one 5 oz glass of wine (148 mL),  or one 1 oz glass of hard liquor (44 mL). Lifestyle Do not use any products that contain nicotine or tobacco. These products include cigarettes, chewing tobacco, and vaping devices, such as e-cigarettes. If you need help quitting, ask your health care provider. Do not use street drugs. Do not share needles. Ask your health care provider for help if you need support or information about quitting drugs. General instructions Schedule regular health, dental, and eye exams. Stay current with your vaccines. Tell your health care provider if: You often feel depressed. You have ever been abused or do not feel safe at home. Summary Adopting a healthy lifestyle and getting preventive care are important in promoting health and wellness. Follow your health care provider's instructions about healthy diet, exercising, and getting tested or screened for diseases. Follow your health care provider's instructions on monitoring your cholesterol and blood  pressure. This information is not intended to replace advice given to you by your health care provider. Make sure you discuss any questions you have with your health care provider. Document Revised: 08/29/2020 Document Reviewed: 08/29/2020 Elsevier Patient Education  2023 [employer].

## 2022-09-06 ENCOUNTER — Other Ambulatory Visit: Payer: Self-pay | Admitting: Physician Assistant

## 2022-09-06 DIAGNOSIS — Z1231 Encounter for screening mammogram for malignant neoplasm of breast: Secondary | ICD-10-CM

## 2022-09-25 ENCOUNTER — Telehealth: Payer: Self-pay

## 2022-09-25 NOTE — Progress Notes (Signed)
  Care Coordination Note  09/25/2022 Name: Connie Cardenas MRN: [id number] DOB: [birth year]  Connie Cardenas is a 72 y.o. year old female who is a primary care patient of Nolene Ebbs and is actively engaged with the Chronic Care Management team. I reached out to Joneen Boers by phone today to assist with re-scheduling a follow up visit with the Pharmacist  Follow up plan: Unsuccessful telephone outreach attempt made. A HIPAA compliant phone message was left for the patient providing contact information and requesting a return call.  The care management team will reach out to the patient again over the next 7 days.  If patient returns call to provider office, please advise to call CCM Care Guide Penne Lash  at [phone]  Penne Lash, RMA Care Guide [facility]  [place], [place] [address] Direct Dial: [phone] [email or web address]

## 2022-09-26 ENCOUNTER — Ambulatory Visit (INDEPENDENT_AMBULATORY_CARE_PROVIDER_SITE_OTHER): Payer: Medicare Other | Admitting: Physician Assistant

## 2022-09-26 ENCOUNTER — Encounter: Payer: Self-pay | Admitting: Physician Assistant

## 2022-09-26 VITALS — BP 140/79 | HR 54 | Ht 65.0 in | Wt 189.0 lb

## 2022-09-26 DIAGNOSIS — E1169 Type 2 diabetes mellitus with other specified complication: Secondary | ICD-10-CM

## 2022-09-26 DIAGNOSIS — E66811 Obesity, class 1: Secondary | ICD-10-CM

## 2022-09-26 DIAGNOSIS — I10 Essential (primary) hypertension: Secondary | ICD-10-CM

## 2022-09-26 DIAGNOSIS — E6609 Other obesity due to excess calories: Secondary | ICD-10-CM

## 2022-09-26 DIAGNOSIS — E785 Hyperlipidemia, unspecified: Secondary | ICD-10-CM | POA: Diagnosis not present

## 2022-09-26 DIAGNOSIS — I6521 Occlusion and stenosis of right carotid artery: Secondary | ICD-10-CM

## 2022-09-26 DIAGNOSIS — Z7984 Long term (current) use of oral hypoglycemic drugs: Secondary | ICD-10-CM

## 2022-09-26 DIAGNOSIS — I350 Nonrheumatic aortic (valve) stenosis: Secondary | ICD-10-CM | POA: Diagnosis not present

## 2022-09-26 LAB — POCT UA - MICROALBUMIN
Albumin/Creatinine Ratio, Urine, POC: <30
Creatinine, POC: 100 mg/dL
Microalbumin Ur, POC: 10 mg/L

## 2022-09-26 LAB — POCT GLYCOSYLATED HEMOGLOBIN (HGB A1C): Hemoglobin A1C: 7.9 % — AB (ref 4.0–5.6)

## 2022-09-26 MED ORDER — PIOGLITAZONE HCL 45 MG PO TABS: ORAL_TABLET | ORAL | 3 refills | Status: DC

## 2022-09-26 MED ORDER — TRULICITY 0.75 MG/0.5ML ~~LOC~~ SOAJ: 0.7500 mg | SUBCUTANEOUS | 0 refills | Status: DC

## 2022-09-26 MED ORDER — LISINOPRIL 10 MG PO TABS: 10.0000 mg | ORAL_TABLET | Freq: Every day | ORAL | 0 refills | Status: DC

## 2022-09-26 MED ORDER — METFORMIN HCL 1000 MG PO TABS: 1000.0000 mg | ORAL_TABLET | Freq: Two times a day (BID) | ORAL | 1 refills | Status: DC

## 2022-09-26 NOTE — Progress Notes (Signed)
Established Patient Office Visit  Subjective   Patient ID: Connie Cardenas, female    DOB: Apr 26, 1950  Age: 72 y.o. MRN: [id number]  Chief Complaint  Patient presents with   Follow-up    HPI Patient is a 72 year old obese female with past medical history of hypertension, aortic valve stenosis, carotic artery stenosis, type 2 diabetes, hyperlipidemia who presents to the clinic for her 71-month follow-up.  Patient's last A1c was 7.2.  She admits she did not start Trulicity due to cost.  Right now her husband's medications are extensive and she did not have the money to spend on Trulicity.  She is taking her metformin and Actos regularly.  She is not checking her sugars or her blood pressure.  She denies any hypoglycemic events.  She denies any open sores or wounds.  She denies any chest pains.  She has had some shortness of breath when she is walking up hills while taking her grandchildren to school.  She denies any lower extremity edema or problems laying flat at night.  She feels like her last cardiology follow-up was September 2023.  Patient does admit she is not exercising or eating like she should. .. Active Ambulatory Problems    Diagnosis Date Noted   Dyslipidemia, goal LDL below 70 10/04/2015   Diabetes mellitus (HCC) 10/04/2015   Essential hypertension, benign 10/04/2015   Class 1 obesity due to excess calories with serious comorbidity and body mass index (BMI) of 31.0 to 31.9 in adult 01/04/2016   Hyperlipidemia associated with type 2 diabetes mellitus (HCC) 04/14/2016   Elevated liver enzymes 07/15/2017   Systolic ejection murmur 07/15/2017   Left carotid bruit 07/15/2017   Liver cyst 04/08/2018   Breast cyst, left 04/11/2018   Moderate aortic valve stenosis 04/11/2018   Bicuspid aortic valve 04/11/2018   Carotid artery stenosis, asymptomatic, right 04/11/2018   Swelling of left index finger 06/11/2018   History of COVID-19 05/05/2019   Chronic right shoulder pain  08/03/2019   IDA (iron deficiency anemia) 11/13/2019   Type II diabetes mellitus with complication, uncontrolled 06/01/2020   Low serum iron 03/20/2021   Asymptomatic varicose veins of right lower extremity 03/20/2021   Foot callus 03/20/2021   Resolved Ambulatory Problems    Diagnosis Date Noted   COVID-19 04/22/2019   Past Medical History:  Diagnosis Date   Diabetes mellitus without complication (HCC)    Hypertension      ROS See HPI.    Objective:     BP (!) 140/79   Pulse (!) 54   Ht 5\' 5"  (1.651 m)   Wt 189 lb (85.7 kg)   SpO2 99%   BMI 31.45 kg/m  BP Readings from Last 3 Encounters:  09/26/22 (!) 140/79  06/27/22 (!) 123/55  04/04/22 137/62   Wt Readings from Last 3 Encounters:  09/26/22 189 lb (85.7 kg)  06/27/22 188 lb (85.3 kg)  04/04/22 188 lb (85.3 kg)    Results for orders placed or performed in visit on 09/26/22  POCT HgB A1C  Result Value Ref Range   Hemoglobin A1C 7.9 (A) 4.0 - 5.6 %   HbA1c POC (<> result, manual entry)     HbA1c, POC (prediabetic range)     HbA1c, POC (controlled diabetic range)    POCT UA - Microalbumin  Result Value Ref Range   Microalbumin Ur, POC 10 mg/L   Creatinine, POC 100 mg/dL   Albumin/Creatinine Ratio, Urine, POC <30      Physical Exam  Constitutional:      Appearance: Normal appearance. She is obese.  HENT:     Head: Normocephalic.  Neck:     Vascular: Carotid bruit present.  Cardiovascular:     Rate and Rhythm: Normal rate and regular rhythm.     Heart sounds: Murmur heard.     Comments: 5/6 SEM with radiation into right carotid.  Pulmonary:     Effort: Pulmonary effort is normal.     Breath sounds: Normal breath sounds.  Musculoskeletal:     Cervical back: No tenderness.     Right lower leg: No edema.     Left lower leg: No edema.  Lymphadenopathy:     Cervical: No cervical adenopathy.  Neurological:     General: No focal deficit present.     Mental Status: She is alert and oriented to  person, place, and time.  Psychiatric:        Mood and Affect: Mood normal.        The 10-year ASCVD risk score (Arnett DK, et al., 2019) is: 21.9%    Assessment & Plan:  Marland KitchenMarland KitchenLinah was seen today for follow-up.  Diagnoses and all orders for this visit:  Type 2 diabetes mellitus with other specified complication, without long-term current use of insulin (HCC) -     POCT HgB A1C -     POCT UA - Microalbumin -     COMPLETE METABOLIC PANEL WITH GFR -     Dulaglutide (TRULICITY) 0.75 MG/0.5ML SOPN; Inject 0.75 mg into the skin once a week. -     pioglitazone (ACTOS) 45 MG tablet; TAKE 1 TABLET(45 MG) BY MOUTH DAILY -     metFORMIN (GLUCOPHAGE) 1000 MG tablet; Take 1 tablet (1,000 mg total) by mouth 2 (two) times daily with a meal.  Moderate aortic valve stenosis -     lisinopril (ZESTRIL) 10 MG tablet; Take 1 tablet (10 mg total) by mouth daily.  Essential hypertension, benign -     COMPLETE METABOLIC PANEL WITH GFR -     lisinopril (ZESTRIL) 10 MG tablet; Take 1 tablet (10 mg total) by mouth daily.  Dyslipidemia, goal LDL below 70  Carotid artery stenosis, asymptomatic, right  Class 1 obesity due to excess calories with serious comorbidity and body mass index (BMI) of 31.0 to 31.9 in adult   A1c not to goal and has worsened since 3 months ago. Patient did not start Trulicity.  She is willing to start this at this time.  I do think if she would add this to the metformin and Actos we can get her to goal. Discussed limiting sugars and carbs and getting 150 minutes of exercise a week Blood pressure is not to goal of 130/80.  She is on ACE.  Increase lisinopril to 10 mg today.  Encouraged her to start checking her blood pressures at home. Cmp ordered today.  Normal mircoalbumin.  LDL was last checked in December and right at goal of 70.  She is on a statin. Foot exam up-to-date with no concerns. Eye exam up-to-date. Pneumonia and flu UTD. Declined covid booster/shingles.   Follow up in 3 months.   Last echo 12/2021. Suggest yearly echo due to AV sclerosis. Suspect SOB due to physical deconditioning. Discussed if worsening or having swelling to let use know.    Return in about 3 months (around 12/27/2022).    [name], PA-C

## 2022-09-27 ENCOUNTER — Other Ambulatory Visit: Payer: Medicare Other | Admitting: Pharmacist

## 2022-09-27 ENCOUNTER — Telehealth: Payer: Medicare Other

## 2022-09-27 LAB — COMPLETE METABOLIC PANEL WITH GFR
AG Ratio: 1.6 (calc) (ref 1.0–2.5)
ALT: 11 U/L (ref 6–29)
AST: 17 U/L (ref 10–35)
Albumin: 4.1 g/dL (ref 3.6–5.1)
Alkaline phosphatase (APISO): 74 U/L (ref 37–153)
BUN: 16 mg/dL (ref 7–25)
CO2: 25 mmol/L (ref 20–32)
Calcium: 9.2 mg/dL (ref 8.6–10.4)
Chloride: 103 mmol/L (ref 98–110)
Creat: 0.72 mg/dL (ref 0.50–1.05)
Globulin: 2.6 g/dL (calc) (ref 1.9–3.7)
Glucose, Bld: 140 mg/dL — ABNORMAL HIGH (ref 65–99)
Potassium: 4.1 mmol/L (ref 3.5–5.3)
Sodium: 140 mmol/L (ref 135–146)
Total Bilirubin: 0.4 mg/dL (ref 0.2–1.2)
Total Protein: 6.7 g/dL (ref 6.1–8.1)
eGFR: 90 mL/min/{1.73_m2} (ref >=60)

## 2022-09-28 NOTE — Progress Notes (Signed)
Pat,   Kidney and liver look great.

## 2022-10-04 ENCOUNTER — Ambulatory Visit
Admission: RE | Admit: 2022-10-04 | Discharge: 2022-10-04 | Disposition: A | Discharge status: Home / Self Care | Payer: Medicare Other | Source: Ambulatory Visit | Attending: Physician Assistant | Admitting: Physician Assistant

## 2022-10-04 DIAGNOSIS — Z1231 Encounter for screening mammogram for malignant neoplasm of breast: Secondary | ICD-10-CM

## 2022-10-08 NOTE — Progress Notes (Signed)
Normal mammogram. Follow up in 1 year.

## 2022-10-24 NOTE — Progress Notes (Signed)
   Care Guide Note  10/24/2022 Name: Connie Cardenas MRN: [id number] DOB: Dec 04, 1950  Referred by: Jomarie Longs, PA-C Reason for referral : Care Coordination (Outreach to schedule f/u with Pharm d cheryl due to [place] being out )   Connie Cardenas is a 72 y.o. year old female who is a primary care patient of Jomarie Longs, PA-C. Connie Cardenas was referred to the pharmacist for assistance related to DM.    Successful contact was made with the patient to discuss pharmacy services including being ready for the pharmacist to call at least 5 minutes before the scheduled appointment time, to have medication bottles and any blood sugar or blood pressure readings ready for review. The patient agreed to meet with the pharmacist via with the pharmacist via telephone visit on (date/time).  11/09/2022  Penne Lash, RMA Care Guide [facility]  [place], [place] [address] Direct Dial: [phone] [email or web address]

## 2022-11-09 ENCOUNTER — Other Ambulatory Visit: Payer: Medicare Other

## 2022-11-22 ENCOUNTER — Other Ambulatory Visit: Payer: Medicare Other

## 2022-11-22 NOTE — Progress Notes (Signed)
11/22/2022 Name: Connie Cardenas MRN: [id number] DOB: 11/28/1950  Chief Complaint  Patient presents with   Medication Management   Connie Cardenas is a 72 y.o. year old female who presented for a telephone visit.   They were referred to the pharmacist by their PCP for assistance in managing diabetes, hypertension, and hyperlipidemia.   Subjective:  Care Team: Primary Care Provider: Nolene Ebbs ; Next Scheduled Visit: 9/11  Medication Access/Adherence Current Pharmacy:  [id number] - [place], [place] - [address] [place] [address] Phone: [phone] Fax: [phone]  Patient reports affordability concerns with their medications: Yes -Cannot afford Trulicity copay Patient reports access/transportation concerns to their pharmacy: No  Patient reports adherence concerns with their medications:  Yes  - Not taking Trulicity  Diabetes: Current medications: metfomin 100mg  BID, pioglitzone 45mg  daily -Patient is currently not taking Trulicity due to cost.  This medication no longer has a patient assistance program, so I did discuss the possibility of changing therapy to Ozempic, which does.  Patient is hesitant to apply based on information these programs request (SS#, personal information, etc) -Patient is not regularly checking home BG -Patient denies hypoglycemic s/sx including dizziness, shakiness, sweating.  -Patient denies hyperglycemic symptoms including polyuria, polydipsia, polyphagia, nocturia, neuropathy, blurred vision. -Endorses that she is working on decreasing carbohydrate intake to help with managing BG and DM -Patient is inquiring about OTC supplement know as Berberine to assist with DM control and weight reduction  Hypertension: Current medications: lisinopril 10 mg daily  -Does not regularly check home BP; last OV reading 140/79 -Patient denies hypotensive s/sx  including dizziness, lightheadedness.  -Patient denies hypertensive symptoms including headache, chest pain, shortness of breath  Hyperlipidemia/ASCVD Risk Reduction Current lipid lowering medications: rosuvastatin 20mg  daily Antiplatelet regimen: ASA 81mg  daily   Objective: Lab Results  Component Value Date   HGBA1C 7.9 (A) 09/26/2022   Lab Results  Component Value Date   CREATININE 0.72 09/26/2022   BUN 16 09/26/2022   NA 140 09/26/2022   K 4.1 09/26/2022   CL 103 09/26/2022   CO2 25 09/26/2022   Lab Results  Component Value Date   CHOL 153 04/19/2022   HDL 62 04/19/2022   LDLCALC 75 04/19/2022   TRIG 80 04/19/2022   CHOLHDL 2.5 04/19/2022   Medications Reviewed Today     Reviewed by Lenna Gilford, RPH (Pharmacist) on 11/22/22 at 1007  Med List Status: <None>   Medication Order Taking? Sig Documenting Provider Last Dose Status Informant  AMBULATORY NON FORMULARY MEDICATION [id number]  Glucometer lancets and test strips for DM to check 1-2 times a day. Jomarie Longs, PA-C  Active   Ascorbic Acid (VITAMIN C PO) [id number] Yes Take by mouth. [provider] Taking Active   aspirin 81 MG tablet [id number] Yes Take 81 mg by mouth daily. [provider] Taking Active   Cholecalciferol (VITAMIN D PO) [id number] Yes Take by mouth. [provider] Taking Active   Dulaglutide (TRULICITY) 0.75 MG/0.5ML SOPN [id number] No Inject 0.75 mg into the skin once a week.  Patient not taking: Reported on 11/22/2022   Jomarie Longs, [place] Not Taking Active   Ferrous Sulfate (IRON PO) [id number] Yes Take 1 tablet by mouth daily. [provider] Taking Active   lisinopril (ZESTRIL) 10 MG tablet [id number] Yes Take 1 tablet (10 mg total) by mouth daily. Jomarie Longs, PA-C Taking  Active   metFORMIN (GLUCOPHAGE) 1000 MG tablet [id number] Yes Take 1 tablet (1,000 mg total) by mouth 2 (two) times daily with a meal. [name], [name], PA-C Taking Active    pioglitazone (ACTOS) 45 MG tablet [id number] Yes TAKE 1 TABLET(45 MG) BY MOUTH DAILY [name], [name], PA-C Taking Active   rosuvastatin (CRESTOR) 20 MG tablet [id number] Yes Take 1 tablet (20 mg total) by mouth daily.  Patient taking differently: Take 20 mg by mouth. Three times a week   [name], [name], PA-C Taking Active            Assessment/Plan:   Diabetes: - Currently uncontrolled - She states she is looking into getting a Tier reduction for her Trulicity copay, but this will get her into the donut hole quicker - She is also looking into getting Rybelsus from [place], which I cannot guarantee the safety/efficacy of  - Patient prefers to implement additional lifestyle changes at this time  - Sending MyChart message with information from reputable health sources regarding Berberine.  Ultimately, there is not enough sound evidence in regard to weightloss, diabetes control, and coronary artery disease.  Many of the studies looked at have varying doses, quite a bit of bias, and medication interactions have been noted between this and metformin and statins.  I do not believe benefit would be worth the risk for this patient at this time.  Hypertension: - Currently uncontrolled based on last office reading, which is not always an accurate reflection - Recommend monitoring and recording at least 2-3x/week - If BP is consistently >1330/80, I recommend increasing lisinopril to 20mg  daily   Hyperlipidemia/ASCVD Risk Reduction: - Currently controlled.  - Continue current regimen and regular follow-up with providers   Follow Up Plan: Patient to reach out if she would like to further discuss Ozempic therapy and PAP.  Lenna Gilford, PharmD, DPLA

## 2022-11-27 ENCOUNTER — Other Ambulatory Visit: Payer: Self-pay

## 2022-11-27 DIAGNOSIS — I350 Nonrheumatic aortic (valve) stenosis: Secondary | ICD-10-CM

## 2022-11-27 DIAGNOSIS — I10 Essential (primary) hypertension: Secondary | ICD-10-CM

## 2022-11-27 MED ORDER — LISINOPRIL 10 MG PO TABS: 10.0000 mg | ORAL_TABLET | Freq: Every day | ORAL | 3 refills | Status: DC

## 2022-11-28 MED ORDER — ROSUVASTATIN CALCIUM 20 MG PO TABS: ORAL_TABLET | ORAL | 3 refills | Status: DC

## 2023-01-02 ENCOUNTER — Encounter: Payer: Self-pay | Admitting: Physician Assistant

## 2023-01-02 ENCOUNTER — Ambulatory Visit (INDEPENDENT_AMBULATORY_CARE_PROVIDER_SITE_OTHER): Payer: Medicare Other | Admitting: Physician Assistant

## 2023-01-02 VITALS — BP 126/72 | HR 67 | Ht 65.0 in | Wt 200.0 lb

## 2023-01-02 DIAGNOSIS — I10 Essential (primary) hypertension: Secondary | ICD-10-CM

## 2023-01-02 DIAGNOSIS — E6609 Other obesity due to excess calories: Secondary | ICD-10-CM

## 2023-01-02 DIAGNOSIS — I6521 Occlusion and stenosis of right carotid artery: Secondary | ICD-10-CM

## 2023-01-02 DIAGNOSIS — E785 Hyperlipidemia, unspecified: Secondary | ICD-10-CM

## 2023-01-02 DIAGNOSIS — I350 Nonrheumatic aortic (valve) stenosis: Secondary | ICD-10-CM

## 2023-01-02 DIAGNOSIS — Z7984 Long term (current) use of oral hypoglycemic drugs: Secondary | ICD-10-CM

## 2023-01-02 DIAGNOSIS — Z6831 Body mass index (BMI) 31.0-31.9, adult: Secondary | ICD-10-CM

## 2023-01-02 DIAGNOSIS — E1169 Type 2 diabetes mellitus with other specified complication: Secondary | ICD-10-CM

## 2023-01-02 LAB — POCT GLYCOSYLATED HEMOGLOBIN (HGB A1C): Hemoglobin A1C: 7.7 % — AB (ref 4.0–5.6)

## 2023-01-02 NOTE — Progress Notes (Signed)
Established Patient Office Visit  Subjective   Patient ID: Connie Cardenas, female    DOB: January 27, 1951  Age: 72 y.o. MRN: [id number]  Chief Complaint  Patient presents with   Medical Management of Chronic Issues    Last A1c was 7.9    HPI Pt is a 72 yo obese female with T2DM, HLD, Aortic Stenosis, HTN who presents to the clinic for 3 month follow up.   Pt is doing very well. She is staying active. She is trying to watch her diet. She is not checking her sugars. She is taking metformin and actos. She denies any CP, palpitations, SOB, swelling, dizziness. No open sores or wounds.   Her husband has h. Pylori and would like to know if she should be tested. She has no GI symptoms.   .. Active Ambulatory Problems    Diagnosis Date Noted   Dyslipidemia, goal LDL below 70 10/04/2015   Diabetes mellitus (HCC) 10/04/2015   Essential hypertension, benign 10/04/2015   Class 1 obesity due to excess calories with serious comorbidity and body mass index (BMI) of 31.0 to 31.9 in adult 01/04/2016   Hyperlipidemia associated with type 2 diabetes mellitus (HCC) 04/14/2016   Elevated liver enzymes 07/15/2017   Systolic ejection murmur 07/15/2017   Left carotid bruit 07/15/2017   Liver cyst 04/08/2018   Breast cyst, left 04/11/2018   Moderate aortic valve stenosis 04/11/2018   Bicuspid aortic valve 04/11/2018   Carotid artery stenosis, asymptomatic, right 04/11/2018   Swelling of left index finger 06/11/2018   History of COVID-19 05/05/2019   Chronic right shoulder pain 08/03/2019   IDA (iron deficiency anemia) 11/13/2019   Type II diabetes mellitus with complication, uncontrolled 06/01/2020   Low serum iron 03/20/2021   Asymptomatic varicose veins of right lower extremity 03/20/2021   Foot callus 03/20/2021   Resolved Ambulatory Problems    Diagnosis Date Noted   COVID-19 04/22/2019   Past Medical History:  Diagnosis Date   Diabetes mellitus without complication (HCC)     Hypertension      Review of Systems  All other systems reviewed and are negative.     Objective:     BP 137/63   Pulse 67   Ht 5\' 5"  (1.651 m)   Wt 200 lb (90.7 kg)   SpO2 99%   BMI 33.28 kg/m  BP Readings from Last 3 Encounters:  01/02/23 126/72  09/26/22 (!) 140/79  06/27/22 (!) 123/55   Wt Readings from Last 3 Encounters:  01/02/23 200 lb (90.7 kg)  09/26/22 189 lb (85.7 kg)  06/27/22 188 lb (85.3 kg)      Physical Exam Constitutional:      Appearance: Normal appearance. She is obese.  HENT:     Head: Normocephalic.  Neck:     Vascular: Carotid bruit present.     Comments: Bilateral carotid bruit Cardiovascular:     Rate and Rhythm: Normal rate and regular rhythm.     Heart sounds: Murmur heard.     Comments: Holosytolic whooshing murmur 5/6  Pulmonary:     Effort: Pulmonary effort is normal.     Breath sounds: Normal breath sounds.  Musculoskeletal:     Right lower leg: No edema.     Left lower leg: No edema.  Neurological:     General: No focal deficit present.     Mental Status: She is alert.  Psychiatric:        Mood and Affect: Mood normal.  Results for orders placed or performed in visit on 01/02/23  POCT HgB A1C  Result Value Ref Range   Hemoglobin A1C 7.7 (A) 4.0 - 5.6 %   HbA1c POC (<> result, manual entry)     HbA1c, POC (prediabetic range)     HbA1c, POC (controlled diabetic range)       The 10-year ASCVD risk score (Arnett DK, et al., 2019) is: 21%    Assessment & Plan:  Marland KitchenMarland KitchenLatesia "Dennie Bible" was seen today for medical management of chronic issues.  Diagnoses and all orders for this visit:  Type 2 diabetes mellitus with other specified complication, without long-term current use of insulin (HCC) -     POCT HgB A1C  Moderate aortic valve stenosis  Essential hypertension, benign  Dyslipidemia, goal LDL below 70  Carotid artery stenosis, asymptomatic, right  Class 1 obesity due to excess calories with serious  comorbidity and body mass index (BMI) of 31.0 to 31.9 in adult   A1C not under 7 but under 8 and improving Stay on same medications Discussed DM diet Pt does not want to take GLP due to cost and insulin due to SE She will try cinnamon and magnesium to see if can lower glucose more BP to goal on 2nd recheck On statin Eye and foot exam UTD Declined vaccines Follow up in 3 months  Moderate aortic stenosis and carotid artery stenosis Echo 2024 no changes Carotid US 2021 39 percent Asymptomatic  Pt has no GI symptoms and discuss no real indication to test for h.pylori.    [name], PA-C

## 2023-03-25 ENCOUNTER — Other Ambulatory Visit: Payer: Self-pay | Admitting: *Deleted

## 2023-03-25 DIAGNOSIS — I6521 Occlusion and stenosis of right carotid artery: Secondary | ICD-10-CM

## 2023-04-03 ENCOUNTER — Ambulatory Visit (INDEPENDENT_AMBULATORY_CARE_PROVIDER_SITE_OTHER): Payer: Medicare Other | Admitting: Physician Assistant

## 2023-04-03 ENCOUNTER — Encounter: Payer: Self-pay | Admitting: Physician Assistant

## 2023-04-03 VITALS — BP 132/78 | HR 82 | Resp 12 | Ht 65.0 in | Wt 200.2 lb

## 2023-04-03 DIAGNOSIS — I350 Nonrheumatic aortic (valve) stenosis: Secondary | ICD-10-CM

## 2023-04-03 DIAGNOSIS — Z7984 Long term (current) use of oral hypoglycemic drugs: Secondary | ICD-10-CM

## 2023-04-03 DIAGNOSIS — I10 Essential (primary) hypertension: Secondary | ICD-10-CM | POA: Diagnosis not present

## 2023-04-03 DIAGNOSIS — E785 Hyperlipidemia, unspecified: Secondary | ICD-10-CM | POA: Diagnosis not present

## 2023-04-03 DIAGNOSIS — M654 Radial styloid tenosynovitis [de Quervain]: Secondary | ICD-10-CM

## 2023-04-03 DIAGNOSIS — D508 Other iron deficiency anemias: Secondary | ICD-10-CM

## 2023-04-03 DIAGNOSIS — E1169 Type 2 diabetes mellitus with other specified complication: Secondary | ICD-10-CM

## 2023-04-03 LAB — POCT GLYCOSYLATED HEMOGLOBIN (HGB A1C): Hemoglobin A1C: 7.6 % — AB (ref 4.0–5.6)

## 2023-04-03 MED ORDER — DICLOFENAC SODIUM 1 % EX GEL: 4.0000 g | Freq: Four times a day (QID) | CUTANEOUS | 1 refills | Status: AC

## 2023-04-03 MED ORDER — METFORMIN HCL 1000 MG PO TABS: 1000.0000 mg | ORAL_TABLET | Freq: Two times a day (BID) | ORAL | 1 refills | Status: DC

## 2023-04-03 NOTE — Progress Notes (Signed)
Established Patient Office Visit  Subjective   Patient ID: Connie Cardenas, female    DOB: 11/14/1950  Age: 72 y.o. MRN: [id number]  Chief Complaint  Patient presents with   Diabetes    Follow up    Diabetes Pertinent negatives for diabetes include no chest pain.   72 yo female presents today for 3 mo diabetic f/u. Last A1c was 7.7. She feels generally well. Denies CP, SOB. She is not checking her sugars at home. She denies any hypoglycemic events.   She is about the same in weight as last visit. She is not exercising.  She is sleeping well. She has been told by her husband that she snores.  She has f/u with cardiovasc tomorrow for carotid bruit.  Also, she hurt her right wrist 6 wks ago after helping someone up from the ground. It hurts to move. She has not really tried anything to make better.   Review of Systems  Respiratory:  Negative for shortness of breath.   Cardiovascular:  Negative for chest pain, orthopnea and leg swelling.  Psychiatric/Behavioral:  The patient does not have insomnia.       Objective:     BP 132/78 (BP Location: Right Arm, Patient Position: Sitting)   Pulse 82   Resp 12   Ht 5\' 5"  (1.651 m)   Wt 200 lb 3.2 oz (90.8 kg)   SpO2 99%   BMI 33.32 kg/m    Physical Exam Constitutional:      Appearance: Normal appearance. She is obese.  HENT:     Head: Normocephalic.  Neck:     Vascular: Carotid bruit present.  Cardiovascular:     Rate and Rhythm: Normal rate and regular rhythm.     Pulses: Normal pulses.     Heart sounds: Murmur heard.     Comments: 4/6 holosystolic ejection murmur Pulmonary:     Effort: Pulmonary effort is normal.     Breath sounds: Normal breath sounds.  Musculoskeletal:     Right wrist: Swelling (hand swelling) and tenderness (tenderness over lateral wrist) present.     Left wrist: Normal.     Right lower leg: No edema.     Left lower leg: No edema.     Comments: Positive Finkelstein's on R wrist.   Neurological:     General: No focal deficit present.     Mental Status: [name], place, and time.  Psychiatric:        Mood and Affect: Mood normal.     .. Results for orders placed or performed in visit on 04/03/23  POCT HgB A1C  Result Value Ref Range   Hemoglobin A1C 7.6 (A) 4.0 - 5.6 %   HbA1c POC (<> result, manual entry)     HbA1c, POC (prediabetic range)     HbA1c, POC (controlled diabetic range)      The 10-year ASCVD risk score (Arnett DK, et al., 2019) is: 19.7%    Assessment & Plan:  Marland KitchenMarland KitchenYalani "Connie Bible" was seen today for diabetes.  Diagnoses and all orders for this visit:  Type 2 diabetes mellitus with other specified complication, without long-term current use of insulin (HCC) -     POCT HgB A1C -     metFORMIN (GLUCOPHAGE) 1000 MG tablet; Take 1 tablet (1,000 mg total) by mouth 2 (two) times daily with a meal. -     Lipid panel -     CMP14+EGFR -     TSH +  free T4 -     CBC w/Diff/Platelet -     Fe+TIBC+Fer  Moderate aortic valve stenosis  Essential hypertension, benign -     CMP14+EGFR  Dyslipidemia, goal LDL below 70 -     Lipid panel  Iron deficiency anemia secondary to inadequate dietary iron intake -     CBC w/Diff/Platelet -     Fe+TIBC+Fer  De Quervain's tenosynovitis, right -     diclofenac Sodium (VOLTAREN) 1 % GEL; Apply 4 g topically 4 (four) times daily. To affected joint.   A1c today was 7.6- not to goal of <7; trending downward. On metformin and pioglitazone. Refilled Metformin. Consider GLP if new insurance covers in new year. Follow up in 3 months  BP well controlled on lisinopril.  Right wrist pain consistent with De Quervain's tenosynovitis. Provided thumb spica splint and recommended keeping it on for at least 2 wks. Prescribed voltaren gel; advised patient to apply 2-4x/day for pain. Recommended icing the area. Exercises given to start.   Last echo (12/2021)showed normal EF and moderate aortic  valve stenosis and regurgitation. She is f/u with cardiovasc tomorrow. On statin Eye and foot exam UTD Declined vaccines. Follow up in 3 months  Return in about 4 weeks (around 05/01/2023) for wrist follow up/ 3 months DM follow up.    [name], PA-C

## 2023-04-03 NOTE — Patient Instructions (Addendum)
Use diclofenac up to 4 times a day [employer] tenosynovitis is a condition that causes inflammation of the tendon on the thumb side of the wrist. Tendons are cords of tissue that connect bones to muscles. The tendons in the hand pass through a tunnel called a sheath. A slippery layer of tissue (synovium) lets the tendons move smoothly in the sheath. With de Quervain's tenosynovitis, the sheath swells or thickens, causing friction and pain. The condition is also called de Quervain's disease and de Quervain's syndrome. It occurs most often in women who are 19-32 years old. What are the causes? The exact cause of this condition is not known. It may be associated with overuse of the hand and wrist. What increases the risk? You are more likely to develop this condition if you: Use your hands far more than normal, especially if you repeat certain movements that involve twisting your hand or using a tight grip. Are pregnant. Are a middle-aged woman. Have rheumatoid arthritis. Have diabetes. What are the signs or symptoms? The main symptom of this condition is pain on the thumb side of the wrist. The pain may get worse when you grasp something or turn your wrist. Other symptoms may include: Pain that extends up the forearm. Swelling of your wrist and hand. Trouble moving the thumb and wrist. A sensation of snapping in the wrist. A bump filled with fluid (cyst) in the area of the pain. How is this diagnosed? This condition may be diagnosed based on: Your symptoms and medical history. A physical exam. During the exam, your health care provider may do a simple test Connie Cardenas test) that involves pulling your thumb and wrist to see if this causes pain. You may also need to have an X-ray or ultrasound. How is this treated? Treatment for this condition may include: Avoiding any activity that causes pain and swelling. Taking medicines. Anti-inflammatory medicines and  corticosteroid injections may be used to reduce inflammation and relieve pain. Wearing a splint. Having surgery. This may be needed if other treatments do not work. Once the pain and swelling have gone down, you may start: Physical therapy. This includes exercises to improve movement and strength in your wrist and thumb. Occupational therapy. This includes adjusting how you move your wrist. Follow these instructions at home: If you have a splint: Wear the splint as told by your health care provider. Remove it only as told by your health care provider. Loosen the splint if your fingers tingle, become numb, or turn cold and blue. Keep the splint clean. If the splint is not waterproof: Do not let it get wet. Cover it with a watertight covering when you take a bath or a shower. Managing pain, stiffness, and swelling  Avoid movements and activities that cause pain and swelling in the wrist area. If directed, put ice on the painful area. This may be helpful after doing activities that involve the sore wrist. To do this: Put ice in a plastic bag. Place a towel between your skin and the bag. Leave the ice on for 20 minutes, 2-3 times a day. Remove the ice if your skin turns bright red. This is very important. If you cannot feel pain, heat, or cold, you have a greater risk of damage to the area. Move your fingers often to reduce stiffness and swelling. Raise (elevate) the injured area above the level of your heart while you are sitting or lying down. General instructions Return to your normal activities as  told by your health care provider. Ask your health care provider what activities are safe for you. Take over-the-counter and prescription medicines only as told by your health care provider. Keep all follow-up visits. This is important. Contact a health care provider if: Your pain medicine does not help. Your pain gets worse. You develop new symptoms. Summary De Quervain's tenosynovitis is  a condition that causes inflammation of the tendon on the thumb side of the wrist. The condition occurs most often in women who are 59-48 years old. The exact cause of this condition is not known. It may be associated with overuse of the hand and wrist. Treatment starts with avoiding activity that causes pain or swelling in the wrist area. Other treatments may include wearing a splint and taking medicine. Sometimes, surgery is needed. This information is not intended to replace advice given to you by your health care provider. Make sure you discuss any questions you have with your health care provider. Document Revised: 07/21/2019 Document Reviewed: 07/22/2019 Elsevier Patient Education  2024 [employer].

## 2023-04-04 ENCOUNTER — Ambulatory Visit (HOSPITAL_COMMUNITY)
Admission: RE | Admit: 2023-04-04 | Discharge: 2023-04-04 | Disposition: A | Discharge status: Home / Self Care | Payer: Medicare Other | Source: Ambulatory Visit | Attending: Vascular Surgery | Admitting: Vascular Surgery

## 2023-04-04 ENCOUNTER — Ambulatory Visit: Payer: Medicare Other | Admitting: Physician Assistant

## 2023-04-04 VITALS — BP 133/70 | HR 16 | Temp 98.1°F | Resp 18 | Ht 65.0 in | Wt 202.1 lb

## 2023-04-04 DIAGNOSIS — I6521 Occlusion and stenosis of right carotid artery: Secondary | ICD-10-CM | POA: Diagnosis present

## 2023-04-04 DIAGNOSIS — I6523 Occlusion and stenosis of bilateral carotid arteries: Secondary | ICD-10-CM | POA: Diagnosis not present

## 2023-04-04 LAB — CBC WITH DIFFERENTIAL/PLATELET
Basophils Absolute: 0 10*3/uL (ref 0.0–0.2)
Basos: 0 %
EOS (ABSOLUTE): 0.1 10*3/uL (ref 0.0–0.4)
Eos: 2 %
Hematocrit: 37.4 % (ref 34.0–46.6)
Hemoglobin: 12 g/dL (ref 11.1–15.9)
Immature Grans (Abs): 0 10*3/uL (ref 0.0–0.1)
Immature Granulocytes: 0 %
Lymphocytes Absolute: 1.9 10*3/uL (ref 0.7–3.1)
Lymphs: 40 %
MCH: 29.4 pg (ref 26.6–33.0)
MCHC: 32.1 g/dL (ref 31.5–35.7)
MCV: 92 fL (ref 79–97)
Monocytes Absolute: 0.3 10*3/uL (ref 0.1–0.9)
Monocytes: 6 %
Neutrophils Absolute: 2.4 10*3/uL (ref 1.4–7.0)
Neutrophils: 52 %
Platelets: 285 10*3/uL (ref 150–450)
RBC: 4.08 x10E6/uL (ref 3.77–5.28)
RDW: 12.5 % (ref 11.7–15.4)
WBC: 4.8 10*3/uL (ref 3.4–10.8)

## 2023-04-04 LAB — CMP14+EGFR
ALT: 15 [IU]/L (ref 0–32)
AST: 20 [IU]/L (ref 0–40)
Albumin: 4.2 g/dL (ref 3.9–4.9)
Alkaline Phosphatase: 88 [IU]/L (ref 44–121)
BUN/Creatinine Ratio: 19 (ref 12–28)
BUN: 15 mg/dL (ref 8–27)
Bilirubin Total: 0.3 mg/dL (ref 0.0–1.2)
CO2: 21 mmol/L (ref 20–29)
Calcium: 9.3 mg/dL (ref 8.7–10.3)
Chloride: 103 mmol/L (ref 96–106)
Creatinine, Ser: 0.81 mg/dL (ref 0.57–1.00)
Globulin, Total: 2.5 g/dL (ref 1.5–4.5)
Glucose: 161 mg/dL — ABNORMAL HIGH (ref 70–99)
Potassium: 4.3 mmol/L (ref 3.5–5.2)
Sodium: 141 mmol/L (ref 134–144)
Total Protein: 6.7 g/dL (ref 6.0–8.5)
eGFR: 79 mL/min/{1.73_m2} (ref >59)

## 2023-04-04 LAB — LIPID PANEL
Chol/HDL Ratio: 4.3 {ratio} (ref 0.0–4.4)
Cholesterol, Total: 260 mg/dL — ABNORMAL HIGH (ref 100–199)
HDL: 61 mg/dL (ref >39)
LDL Chol Calc (NIH): 176 mg/dL — ABNORMAL HIGH (ref 0–99)
Triglycerides: 128 mg/dL (ref 0–149)
VLDL Cholesterol Cal: 23 mg/dL (ref 5–40)

## 2023-04-04 LAB — IRON,TIBC AND FERRITIN PANEL
Ferritin: 42 ng/mL (ref 15–150)
Iron Saturation: 21 % (ref 15–55)
Iron: 70 ug/dL (ref 27–139)
Total Iron Binding Capacity: 333 ug/dL (ref 250–450)
UIBC: 263 ug/dL (ref 118–369)

## 2023-04-04 LAB — TSH+FREE T4
Free T4: 1.23 ng/dL (ref 0.82–1.77)
TSH: 1.45 u[IU]/mL (ref 0.450–4.500)

## 2023-04-04 NOTE — Progress Notes (Signed)
Office Note     CC:  follow up Requesting Provider:  Jomarie Longs, PA-C  HPI: Connie Cardenas is a 72 y.o. (08/13/1950) female who presents for surveillance follow up of carotid artery stenosis.  She was initially seen in 2020 with carotid duplex suggesting 60-70% right ICA stenosis. However on follow up this was not replicable. She has no history of TIA or CVA. At her last visit in November of 2021 she was doing well without any symptoms concerning from carotid disease. She is medically managed for Diabetes, HTN, HLD  Today she reports no major changes in her medical history since her last visit. She says she has overall been doing very well. She denies any visual changes although she says she is developing cataracts. She denies any slurred speech, facial drooping, unilateral upper or lower extremity weakness or numbness. She is medically managed on Statin and Aspirin.   Past Medical History:  Diagnosis Date   Carotid artery occlusion    Diabetes mellitus without complication (HCC)    Hypertension     Past Surgical History:  Procedure Laterality Date   TUBAL LIGATION      Social History   Socioeconomic History   Marital status: Married    Spouse name: Griffin Basil   Number of children: 5   Years of education: 16   Highest education level: Bachelor's degree (e.g., BA, AB, BS)  Occupational History   Occupation: Retired  Tobacco Use   Smoking status: Never   Smokeless tobacco: Never  Vaping Use   Vaping status: Never Used  Substance and Sexual Activity   Alcohol use: No    Alcohol/week: 0.0 standard drinks of alcohol   Drug use: No   Sexual activity: Not Currently    Birth control/protection: Surgical  Other Topics Concern   Not on file  Social History Narrative   Lives with her spouse. She has five children. She enjoys crafting and reading.   Social Drivers of Corporate investment banker Strain: Low Risk  (08/21/2022)   Overall Financial Resource  Strain (CARDIA)    Difficulty of Paying Living Expenses: Not hard at all  Food Insecurity: No Food Insecurity (08/21/2022)   Hunger Vital Sign    Worried About Running Out of Food in the Last Year: Never true    Ran Out of Food in the Last Year: Never true  Transportation Needs: No Transportation Needs (08/21/2022)   PRAPARE - Administrator, Civil Service (Medical): No    Lack of Transportation (Non-Medical): No  Physical Activity: Inactive (08/21/2022)   Exercise Vital Sign    Days of Exercise per Week: 0 days    Minutes of Exercise per Session: 0 min  Stress: Not on file  Social Connections: Moderately Integrated (08/21/2022)   Social Connection and Isolation Panel [NHANES]    Frequency of Communication with Friends and Family: More than three times a week    Frequency of Social Gatherings with Friends and Family: More than three times a week    Attends Religious Services: Never    Database administrator or Organizations: Yes    Attends Engineer, structural: More than 4 times per year    Marital Status: Married  Recent Concern: Social Connections - Moderately Isolated (08/21/2022)   Social Connection and Isolation Panel [NHANES]    Frequency of Communication with Friends and Family: More than three times a week    Frequency of Social Gatherings with Friends and Family:  More than three times a week    Attends Religious Services: Never    Active Member of Clubs or Organizations: No    Attends Banker Meetings: Never    Marital Status: Married  Catering manager Violence: Not At Risk (08/21/2022)   Humiliation, Afraid, Rape, and Kick questionnaire    Fear of Current or Ex-Partner: No    Emotionally Abused: No    Physically Abused: No    Sexually Abused: No    Family History  Problem Relation Age of Onset   Cancer Sister        non-hodkins lymphoma   Cancer Mother        colon   Hypertension Mother    Heart attack Father    Hyperlipidemia Father     Stroke Paternal Grandmother     Current Outpatient Medications  Medication Sig Dispense Refill   AMBULATORY NON FORMULARY MEDICATION Glucometer lancets and test strips for DM to check 1-2 times a day. 100 each 0   Ascorbic Acid (VITAMIN C PO) Take by mouth.     aspirin 81 MG tablet Take 81 mg by mouth daily.     Cholecalciferol (VITAMIN D PO) Take by mouth.     diclofenac Sodium (VOLTAREN) 1 % GEL Apply 4 g topically 4 (four) times daily. To affected joint. 100 g 1   Ferrous Sulfate (IRON PO) Take 1 tablet by mouth daily.     lisinopril (ZESTRIL) 10 MG tablet Take 1 tablet (10 mg total) by mouth daily. 90 tablet 3   metFORMIN (GLUCOPHAGE) 1000 MG tablet Take 1 tablet (1,000 mg total) by mouth 2 (two) times daily with a meal. 180 tablet 1   pioglitazone (ACTOS) 45 MG tablet TAKE 1 TABLET(45 MG) BY MOUTH DAILY 90 tablet 3   rosuvastatin (CRESTOR) 20 MG tablet Take 1 tablet by mouth three times a week 36 tablet 3   No current facility-administered medications for this visit.    Allergies  Allergen Reactions   Farxiga [Dapagliflozin]     Recurrent yeast infections.    Penicillins      REVIEW OF SYSTEMS:   [X]  denotes positive finding, [ ]  denotes negative finding Cardiac  Comments:  Chest pain or chest pressure:    Shortness of breath upon exertion:    Short of breath when lying flat:    Irregular heart rhythm: X Has a murmur      Vascular    Pain in calf, thigh, or hip brought on by ambulation:    Pain in feet at night that wakes you up from your sleep:     Blood clot in your veins:    Leg swelling:         Pulmonary    Oxygen at home:    Productive cough:     Wheezing:         Neurologic    Sudden weakness in arms or legs:     Sudden numbness in arms or legs:     Sudden onset of difficulty speaking or slurred speech:    Temporary loss of vision in one eye:     Problems with dizziness:         Gastrointestinal    Blood in stool:     Vomited blood:          Genitourinary    Burning when urinating:     Blood in urine:        Psychiatric    Major depression:  Hematologic    Bleeding problems:    Problems with blood clotting too easily:        Skin    Rashes or ulcers:        Constitutional    Fever or chills:      PHYSICAL EXAMINATION:  Vitals:   04/04/23 1139  BP: 133/70  Pulse: (!) 16  Resp: 18  Temp: 98.1 F (36.7 C)  TempSrc: Temporal  SpO2: 94%  Weight: 202 lb 1.6 oz (91.7 kg)  Height: 5\' 5"  (1.651 m)    General:  WDWN in NAD; vital signs documented above Gait: Normal HENT: WNL, normocephalic Pulmonary: normal non-labored breathing , without wheezing Cardiac: regular HR, audible pansystolic murmur Abdomen: soft, NT, no masses Vascular Exam/Pulses: 2+ radial, 2+ DP pulses bilaterally Extremities: without ischemic changes, without Gangrene , without cellulitis; without open wounds; moves extremities without deficits Musculoskeletal: no muscle wasting or atrophy  Neurologic: A&O X 3, speech coherent Psychiatric:  The pt has Normal affect.   Non-Invasive Vascular Imaging:   VAS Carotid Duplex: Summary:  Right Carotid: Velocities in the right ICA are consistent with a 1-39% stenosis.   Left Carotid: Velocities in the left ICA are consistent with a 1-39% stenosis.   Vertebrals: Bilateral vertebral arteries demonstrate antegrade flow.  Subclavians: Normal flow hemodynamics were seen in bilateral subclavian arteries.    ASSESSMENT/PLAN:: 72 y.o. female here for follow up for surveillance follow up of carotid artery stenosis. Duplex shows 1-39% ICA stenosis bilaterally. Normal flow in her vertebral and subclavian arteries bilaterally. She has no associated neurological symptoms. Will make referral to Cardiology for evaluation of her murmur. She has known about this for a long time but has never had it formally evaluated. -Continue Aspirin and Statin - She will follow up in 3 years with carotid  duplex   Graceann Congress, PA-C [facility] [phone]  Clinic MD:   Karin Lieu

## 2023-04-05 NOTE — Progress Notes (Signed)
Pat,   Iron stores look good.  Normal WBC and Hemoglobin.  Thyroid looks great.  Kidney and liver look good.  LDL not to goal.   Is one tablet 3 times a week the most you can tolerate? If so in the next year I think we should look at adding another medication to help get LDL down.   Marland Kitchen.The 10-year ASCVD risk score (Arnett DK, et al., 2019) is: 23.6%   Values used to calculate the score:     Age: 72 years     Sex: Female     Is Non-Hispanic African American: No     Diabetic: Yes     Tobacco smoker: No     Systolic Blood Pressure: 133 mmHg     Is BP treated: Yes     HDL Cholesterol: 61 mg/dL     Total Cholesterol: 260 mg/dL

## 2023-05-01 ENCOUNTER — Ambulatory Visit (INDEPENDENT_AMBULATORY_CARE_PROVIDER_SITE_OTHER): Payer: Medicare Other | Admitting: Physician Assistant

## 2023-05-01 ENCOUNTER — Encounter: Payer: Self-pay | Admitting: Physician Assistant

## 2023-05-01 VITALS — BP 128/62 | HR 86 | Ht 65.0 in | Wt 203.0 lb

## 2023-05-01 DIAGNOSIS — M654 Radial styloid tenosynovitis [de Quervain]: Secondary | ICD-10-CM

## 2023-05-01 MED ORDER — MELOXICAM 15 MG PO TABS: 15.0000 mg | ORAL_TABLET | Freq: Every day | ORAL | 0 refills | Status: DC

## 2023-05-01 NOTE — Patient Instructions (Addendum)
 Stay in thumb spica for next 2-3 weeks until follow up with Dr. ONEIDA.  Added mobic  daily for next 2-3 weeks.   De Quervain's Tenosynovitis  De Quervain's tenosynovitis is a condition that causes inflammation of the tendon on the thumb side of the wrist. Tendons are cords of tissue that connect bones to muscles. The tendons in the hand pass through a tunnel called a sheath. A slippery layer of tissue (synovium) lets the tendons move smoothly in the sheath. With de Quervain's tenosynovitis, the sheath swells or thickens, causing friction and pain. The condition is also called de Quervain's disease and de Quervain's syndrome. It occurs most often in women who are 11-18 years old. What are the causes? The exact cause of this condition is not known. It may be associated with overuse of the hand and wrist. What increases the risk? You are more likely to develop this condition if you: Use your hands far more than normal, especially if you repeat certain movements that involve twisting your hand or using a tight grip. Are pregnant. Are a middle-aged woman. Have rheumatoid arthritis. Have diabetes. What are the signs or symptoms? The main symptom of this condition is pain on the thumb side of the wrist. The pain may get worse when you grasp something or turn your wrist. Other symptoms may include: Pain that extends up the forearm. Swelling of your wrist and hand. Trouble moving the thumb and wrist. A sensation of snapping in the wrist. A bump filled with fluid (cyst) in the area of the pain. How is this diagnosed? This condition may be diagnosed based on: Your symptoms and medical history. A physical exam. During the exam, your health care provider may do a simple test Ananias test) that involves pulling your thumb and wrist to see if this causes pain. You may also need to have an X-ray or ultrasound. How is this treated? Treatment for this condition may include: Avoiding any activity that  causes pain and swelling. Taking medicines. Anti-inflammatory medicines and corticosteroid injections may be used to reduce inflammation and relieve pain. Wearing a splint. Having surgery. This may be needed if other treatments do not work. Once the pain and swelling have gone down, you may start: Physical therapy. This includes exercises to improve movement and strength in your wrist and thumb. Occupational therapy. This includes adjusting how you move your wrist. Follow these instructions at home: If you have a splint: Wear the splint as told by your health care provider. Remove it only as told by your health care provider. Loosen the splint if your fingers tingle, become numb, or turn cold and blue. Keep the splint clean. If the splint is not waterproof: Do not let it get wet. Cover it with a watertight covering when you take a bath or a shower. Managing pain, stiffness, and swelling  Avoid movements and activities that cause pain and swelling in the wrist area. If directed, put ice on the painful area. This may be helpful after doing activities that involve the sore wrist. To do this: Put ice in a plastic bag. Place a towel between your skin and the bag. Leave the ice on for 20 minutes, 2-3 times a day. Remove the ice if your skin turns bright red. This is very important. If you cannot feel pain, heat, or cold, you have a greater risk of damage to the area. Move your fingers often to reduce stiffness and swelling. Raise (elevate) the injured area above the level of your  heart while you are sitting or lying down. General instructions Return to your normal activities as told by your health care provider. Ask your health care provider what activities are safe for you. Take over-the-counter and prescription medicines only as told by your health care provider. Keep all follow-up visits. This is important. Contact a health care provider if: Your pain medicine does not help. Your pain  gets worse. You develop new symptoms. Summary De Quervain's tenosynovitis is a condition that causes inflammation of the tendon on the thumb side of the wrist. The condition occurs most often in women who are 53-41 years old. The exact cause of this condition is not known. It may be associated with overuse of the hand and wrist. Treatment starts with avoiding activity that causes pain or swelling in the wrist area. Other treatments may include wearing a splint and taking medicine. Sometimes, surgery is needed. This information is not intended to replace advice given to you by your health care provider. Make sure you discuss any questions you have with your health care provider. Document Revised: 07/21/2019 Document Reviewed: 07/22/2019 Elsevier Patient Education  2024 [employer].

## 2023-05-01 NOTE — Progress Notes (Signed)
 Established Patient Office Visit  Subjective   Patient ID: Connie Cardenas, female    DOB: [birth year]  Age: 73 y.o. MRN: [id number]  Chief Complaint  Patient presents with   Wrist Pain    Pt states her pain is 5-10 with limited rom    HPI  73 yo female presents today for a f/u on a right wrist injury that occurred approx 10 weeks ago. Pt states she used her thumb spica splint for about two weeks as instructed but is still experiencing some pain with wrist flexion and adduction, (5/10)however, she does claim to feel about 30% better. She claims she stopped using the topical Voltaren  gel due to fears of having a heart attack or stroke. She also states that she has some pain and difficulty when writing for prolonged amounts of time or holding heavy items in her right hand. The pain is localized to her wrist and is non-radiating to the forearm.   .. Active Ambulatory Problems    Diagnosis Date Noted   Dyslipidemia, goal LDL below 70 10/04/2015   Diabetes mellitus (HCC) 10/04/2015   Essential hypertension, benign 10/04/2015   Class 1 obesity due to excess calories with serious comorbidity and body mass index (BMI) of 31.0 to 31.9 in adult 01/04/2016   Hyperlipidemia associated with type 2 diabetes mellitus (HCC) 04/14/2016   Elevated liver enzymes 07/15/2017   Systolic ejection murmur 07/15/2017   Left carotid bruit 07/15/2017   Liver cyst 04/08/2018   Breast cyst, left 04/11/2018   Moderate aortic valve stenosis 04/11/2018   Bicuspid aortic valve 04/11/2018   Carotid artery stenosis, asymptomatic, right 04/11/2018   Swelling of left index finger 06/11/2018   History of COVID-19 05/05/2019   Chronic right shoulder pain 08/03/2019   IDA (iron deficiency anemia) 11/13/2019   Type II diabetes mellitus with complication, uncontrolled 06/01/2020   Low serum iron 03/20/2021   Asymptomatic varicose veins of right lower extremity 03/20/2021   Foot callus 03/20/2021   Resolved  Ambulatory Problems    Diagnosis Date Noted   COVID-19 04/22/2019   Past Medical History:  Diagnosis Date   Carotid artery occlusion    Diabetes mellitus without complication (HCC)    Hypertension      ROS   See HPI.  Objective:     BP 128/62   Pulse 86   Ht 5' 5 (1.651 m)   Wt 203 lb (92.1 kg)   SpO2 99%   BMI 33.78 kg/m  BP Readings from Last 3 Encounters:  05/01/23 128/62  04/04/23 133/70  04/03/23 132/78   Wt Readings from Last 3 Encounters:  05/01/23 203 lb (92.1 kg)  04/04/23 202 lb 1.6 oz (91.7 kg)  04/03/23 200 lb 3.2 oz (90.8 kg)      Physical Exam Constitutional:      Appearance: Normal appearance.  HENT:     Head: Normocephalic.  Cardiovascular:     Rate and Rhythm: Normal rate.  Pulmonary:     Effort: Pulmonary effort is normal.  Musculoskeletal:     Comments: Right wrist- No swelling or warmth noted Tenderness over radial wrist and tendon Pain with finkelstein Pain with flexion and extension of wrist  5/5 hand grip Negative Phalen or tinel's No pain over CMC or hand to palpation  Neurological:     General: No focal deficit present.     Mental Status: [name], place, and time.  Psychiatric:  Mood and Affect: Mood normal.      The 10-year ASCVD risk score (Arnett DK, et al., 2019) is: 24.2%    Assessment & Plan:  SABRASABRAOlia Hinderliter was seen today for wrist pain.  Diagnoses and all orders for this visit:  De Quervain's tenosynovitis, right -     meloxicam  (MOBIC ) 15 MG tablet; Take 1 tablet (15 mg total) by mouth daily. -     DG Wrist Complete Right; Future   Pt reports about 30 percent better Xray ordered Get back in thumb spica Start mobic  daily  Continue icing and exercises Follow up with Dr. ONEIDA    Return in about 2 weeks (around 05/15/2023) for Dr. ONEIDA for right wrist pain.    Aydrien Froman, PA-C

## 2023-05-15 ENCOUNTER — Ambulatory Visit: Payer: Medicare Other | Admitting: Sports Medicine

## 2023-05-21 ENCOUNTER — Ambulatory Visit (INDEPENDENT_AMBULATORY_CARE_PROVIDER_SITE_OTHER): Payer: Medicare Other | Admitting: Sports Medicine

## 2023-05-21 DIAGNOSIS — M654 Radial styloid tenosynovitis [de Quervain]: Secondary | ICD-10-CM | POA: Diagnosis not present

## 2023-05-21 NOTE — Progress Notes (Signed)
    Procedures performed today:    None.  Independent interpretation of notes and tests performed by another provider:   None.  Brief History, Exam, Impression, and Recommendations:    De Quervain's tenosynovitis, right This is a very pleasant 73 year old female, she has had many months of pain right forearm since before October, she was diagnosed with de Quervain's tendinitis and placed in a spica brace approximately 6 weeks ago, she has noted some small improvements. On exam she has tenderness over the first extensor compartment and lesser so at the [facility], positive Finkelstein sign. Currently the pain is not bad enough to warrant an injection, she will continue her home physical therapy, topical analgesics, bracing intermittently and return to see me as needed for injection.    ____________________________________________ Ihor Austin. Benjamin Stain, M.D., ABFM., CAQSM., AME. Primary Care and Sports Medicine [facility]  Adjunct Professor of Family Medicine  [employer] [facility] [employer]  Restaurant manager, fast food

## 2023-05-21 NOTE — Assessment & Plan Note (Addendum)
This is a very pleasant 73 year old female, she has had many months of pain right forearm since before October, she was diagnosed with de Quervain's tendinitis and placed in a spica brace approximately 6 weeks ago, she has noted some small improvements. On exam she has tenderness over the first extensor compartment and lesser so at the [facility], positive Finkelstein sign. Currently the pain is not bad enough to warrant an injection, she will continue her home physical therapy, topical analgesics, bracing intermittently and return to see me as needed for injection.

## 2023-07-02 ENCOUNTER — Ambulatory Visit (INDEPENDENT_AMBULATORY_CARE_PROVIDER_SITE_OTHER): Payer: Medicare Other | Admitting: Physician Assistant

## 2023-07-02 ENCOUNTER — Encounter: Payer: Self-pay | Admitting: Physician Assistant

## 2023-07-02 VITALS — BP 136/77 | HR 80 | Ht 65.0 in | Wt 202.5 lb

## 2023-07-02 DIAGNOSIS — E1169 Type 2 diabetes mellitus with other specified complication: Secondary | ICD-10-CM

## 2023-07-02 DIAGNOSIS — Z780 Asymptomatic menopausal state: Secondary | ICD-10-CM | POA: Insufficient documentation

## 2023-07-02 DIAGNOSIS — E1165 Type 2 diabetes mellitus with hyperglycemia: Secondary | ICD-10-CM

## 2023-07-02 DIAGNOSIS — R0602 Shortness of breath: Secondary | ICD-10-CM | POA: Insufficient documentation

## 2023-07-02 DIAGNOSIS — E559 Vitamin D deficiency, unspecified: Secondary | ICD-10-CM | POA: Insufficient documentation

## 2023-07-02 DIAGNOSIS — R5383 Other fatigue: Secondary | ICD-10-CM | POA: Insufficient documentation

## 2023-07-02 DIAGNOSIS — E785 Hyperlipidemia, unspecified: Secondary | ICD-10-CM

## 2023-07-02 DIAGNOSIS — R609 Edema, unspecified: Secondary | ICD-10-CM

## 2023-07-02 DIAGNOSIS — I350 Nonrheumatic aortic (valve) stenosis: Secondary | ICD-10-CM

## 2023-07-02 DIAGNOSIS — I10 Essential (primary) hypertension: Secondary | ICD-10-CM

## 2023-07-02 DIAGNOSIS — Z7984 Long term (current) use of oral hypoglycemic drugs: Secondary | ICD-10-CM

## 2023-07-02 DIAGNOSIS — I6521 Occlusion and stenosis of right carotid artery: Secondary | ICD-10-CM

## 2023-07-02 LAB — POCT GLYCOSYLATED HEMOGLOBIN (HGB A1C): Hemoglobin A1C: 8 % — AB (ref 4.0–5.6)

## 2023-07-02 MED ORDER — RYBELSUS 7 MG PO TABS: 7.0000 mg | ORAL_TABLET | Freq: Every day | ORAL | 0 refills | Status: DC

## 2023-07-02 MED ORDER — RYBELSUS 3 MG PO TABS: 3.0000 mg | ORAL_TABLET | Freq: Every day | ORAL | 0 refills | Status: DC

## 2023-07-02 NOTE — Progress Notes (Signed)
 Established Patient Office Visit  Subjective   Patient ID: Connie Cardenas, female    DOB: 10-May-1950  Age: 73 y.o. MRN: [id number]  CC: 3 month T2DM follow up  HPI Patient is a 73 yo female presents today for 3 mo diabetic f/u. Last A1c was 7.6. She feels generally well.    She is about the same in weight as last visit. She is not exercising and she states she has been eating out a lot. She is compliant with medications. She is taking Crestor every other day. Denies any hypoglycemic events. She does not check her sugars - she states that she ran out of test strips.  Denies chest pain, dizziness, swelling in the extremities, and headaches.   She does endorse shortness of breath and pressure in her chest after moving around and walking. She also endorses ongoing fatigue. She did go to the cardiologist and has a stress test and ECHO scheduled for this Thursday, 3/13.  .. Active Ambulatory Problems    Diagnosis Date Noted   Dyslipidemia, goal LDL below 70 10/04/2015   Diabetes mellitus (HCC) 10/04/2015   Essential hypertension, benign 10/04/2015   Class 1 obesity due to excess calories with serious comorbidity and body mass index (BMI) of 31.0 to 31.9 in adult 01/04/2016   Hyperlipidemia associated with type 2 diabetes mellitus (HCC) 04/14/2016   Elevated liver enzymes 07/15/2017   Systolic ejection murmur 07/15/2017   Left carotid bruit 07/15/2017   Liver cyst 04/08/2018   Breast cyst, left 04/11/2018   Moderate aortic valve stenosis 04/11/2018   Bicuspid aortic valve 04/11/2018   Carotid artery stenosis, asymptomatic, right 04/11/2018   Swelling of left index finger 06/11/2018   History of COVID-19 05/05/2019   Chronic right shoulder pain 08/03/2019   IDA (iron deficiency anemia) 11/13/2019   Type II diabetes mellitus with complication, uncontrolled 06/01/2020   Low serum iron 03/20/2021   Asymptomatic varicose veins of right lower extremity 03/20/2021   Foot callus  03/20/2021   De Quervain's tenosynovitis, right 05/01/2023   No energy 07/02/2023   SOB (shortness of breath) 07/02/2023   Post-menopausal 07/02/2023   Vitamin D deficiency 07/02/2023   Resolved Ambulatory Problems    Diagnosis Date Noted   COVID-19 04/22/2019   Past Medical History:  Diagnosis Date   Carotid artery occlusion    Diabetes mellitus without complication (HCC)    Hypertension     ROS See HPI   Objective:     BP 136/77 (BP Location: Left Arm, Patient Position: Sitting, Cuff Size: Large)   Pulse 80   Ht 5\' 5"  (1.651 m)   Wt 91.9 kg   SpO2 97%   BMI 33.70 kg/m  BP Readings from Last 3 Encounters:  07/02/23 136/77  05/01/23 128/62  04/04/23 133/70   Wt Readings from Last 3 Encounters:  07/02/23 91.9 kg  05/01/23 92.1 kg  04/04/23 91.7 kg   SpO2 Readings from Last 3 Encounters:  07/02/23 97%  05/01/23 99%  04/04/23 94%   Physical Exam Constitutional:      Appearance: She is obese.  HENT:     Head: Normocephalic and atraumatic.  Cardiovascular:     Rate and Rhythm: Normal rate and regular rhythm.     Pulses: Normal pulses.     Heart sounds: Murmur heard.     Comments: 4/6 systolic ejection murmur  Pulmonary:     Effort: Pulmonary effort is normal.     Breath sounds: Normal breath sounds.  Musculoskeletal:  General: Normal range of motion.     Comments: Lipidemia present on the skins bilaterally   Skin:    General: Skin is warm.  Neurological:     General: No focal deficit present.     Mental Status: She is alert.  Psychiatric:        Mood and Affect: Mood normal.        Behavior: Behavior normal.    Last CBC Lab Results  Component Value Date   WBC 4.8 04/03/2023   HGB 12.0 04/03/2023   HCT 37.4 04/03/2023   MCV 92 04/03/2023   MCH 29.4 04/03/2023   RDW 12.5 04/03/2023   PLT 285 04/03/2023   Last metabolic panel Lab Results  Component Value Date   GLUCOSE 161 (H) 04/03/2023   NA 141 04/03/2023   K 4.3 04/03/2023   CL  103 04/03/2023   CO2 21 04/03/2023   BUN 15 04/03/2023   CREATININE 0.81 04/03/2023   EGFR 79 04/03/2023   CALCIUM 9.3 04/03/2023   PROT 6.7 04/03/2023   ALBUMIN 4.2 04/03/2023   LABGLOB 2.5 04/03/2023   BILITOT 0.3 04/03/2023   ALKPHOS 88 04/03/2023   AST 20 04/03/2023   ALT 15 04/03/2023   Last lipids Lab Results  Component Value Date   CHOL 260 (H) 04/03/2023   HDL 61 04/03/2023   LDLCALC 176 (H) 04/03/2023   TRIG 128 04/03/2023   CHOLHDL 4.3 04/03/2023   Last hemoglobin A1c Lab Results  Component Value Date   HGBA1C 8.0 (A) 07/02/2023   Last thyroid functions Lab Results  Component Value Date   TSH 1.450 04/03/2023   The 10-year ASCVD risk score (Arnett DK, et al., 2019) is: 26.9%    Assessment & Plan:  Marland KitchenMarland KitchenCarlissa "Dennie Bible" was seen today for medical management of chronic issues.  Diagnoses and all orders for this visit:  Uncontrolled type 2 diabetes mellitus with hyperglycemia (HCC) -     POCT HgB A1C  No energy -     CMP14+EGFR -     CBC w/Diff/Platelet -     Magnesium -     B12 and Folate Panel -     VITAMIN D 25 Hydroxy (Vit-D Deficiency, Fractures)  SOB (shortness of breath) -     CMP14+EGFR -     CBC w/Diff/Platelet -     Magnesium -     B12 and Folate Panel -     VITAMIN D 25 Hydroxy (Vit-D Deficiency, Fractures)  Post-menopausal -     VITAMIN D 25 Hydroxy (Vit-D Deficiency, Fractures)  Vitamin D deficiency -     VITAMIN D 25 Hydroxy (Vit-D Deficiency, Fractures)  Other orders -     Semaglutide (RYBELSUS) 3 MG TABS; Take 1 tablet (3 mg total) by mouth daily. -     Semaglutide (RYBELSUS) 7 MG TABS; Take 1 tablet (7 mg total) by mouth daily.    - HgB A1C today: 8.0%  - Increase from 7.6% 3 months ago   - Rybelsus 3mg  daily sent to pharmacy to try to get it covered through her insurance  - Continue Pioglitozone  - Patient educated on side effects of this medication   - Emphasized importance of diet and exercise; eating out less  - Denies  Shingles vaccine today  - DM eye exam completed in January @ [facility] - Lymphatic massage and compression stockings 15-74mmHg for lipidemia - CMP, CBC ordered today for fatigue     Ilean China, [employer]

## 2023-07-03 ENCOUNTER — Encounter: Payer: Self-pay | Admitting: Physician Assistant

## 2023-07-03 DIAGNOSIS — R609 Edema, unspecified: Secondary | ICD-10-CM | POA: Insufficient documentation

## 2023-07-03 MED ORDER — PIOGLITAZONE HCL 45 MG PO TABS
ORAL_TABLET | ORAL | 3 refills | Status: AC
Start: 2023-07-03 — End: ?

## 2023-07-03 NOTE — Progress Notes (Signed)
 Pat,   Magnesium normal range but low normal. Ok to start magnesium gycinate 250mg  daily at bedtime.  Vitamin D looks GREAT.  Kidney, liver, glucose looks great.

## 2023-07-04 LAB — CMP14+EGFR
ALT: 12 IU/L (ref 0–32)
AST: 21 IU/L (ref 0–40)
Albumin: 4.4 g/dL (ref 3.9–4.9)
Alkaline Phosphatase: 76 IU/L (ref 44–121)
BUN/Creatinine Ratio: 19 (ref 12–28)
BUN: 13 mg/dL (ref 8–27)
Bilirubin Total: 0.2 mg/dL (ref 0.0–1.2)
CO2: 17 mmol/L — ABNORMAL LOW (ref 20–29)
Calcium: 9.4 mg/dL (ref 8.7–10.3)
Chloride: 106 mmol/L (ref 96–106)
Creatinine, Ser: 0.7 mg/dL (ref 0.57–1.00)
Globulin, Total: 2.5 g/dL (ref 1.5–4.5)
Glucose: 172 mg/dL — ABNORMAL HIGH (ref 70–99)
Potassium: 4.5 mmol/L (ref 3.5–5.2)
Sodium: 144 mmol/L (ref 134–144)
Total Protein: 6.9 g/dL (ref 6.0–8.5)
eGFR: 93 mL/min/{1.73_m2} (ref >59)

## 2023-07-04 LAB — CBC WITH DIFFERENTIAL/PLATELET
Basophils Absolute: 0 10*3/uL (ref 0.0–0.2)
Basos: 0 %
EOS (ABSOLUTE): 0.1 10*3/uL (ref 0.0–0.4)
Eos: 2 %
Hematocrit: 35.3 % (ref 34.0–46.6)
Hemoglobin: 11.4 g/dL (ref 11.1–15.9)
Immature Grans (Abs): 0 10*3/uL (ref 0.0–0.1)
Immature Granulocytes: 0 %
Lymphocytes Absolute: 1.7 10*3/uL (ref 0.7–3.1)
Lymphs: 32 %
MCH: 29.5 pg (ref 26.6–33.0)
MCHC: 32.3 g/dL (ref 31.5–35.7)
MCV: 91 fL (ref 79–97)
Monocytes Absolute: 0.3 10*3/uL (ref 0.1–0.9)
Monocytes: 6 %
Neutrophils Absolute: 3.1 10*3/uL (ref 1.4–7.0)
Neutrophils: 60 %
Platelets: 289 10*3/uL (ref 150–450)
RBC: 3.87 x10E6/uL (ref 3.77–5.28)
RDW: 12.9 % (ref 11.7–15.4)
WBC: 5.3 10*3/uL (ref 3.4–10.8)

## 2023-07-04 LAB — B12 AND FOLATE PANEL

## 2023-07-04 LAB — MAGNESIUM: Magnesium: 1.8 mg/dL (ref 1.6–2.3)

## 2023-07-04 LAB — VITAMIN D 25 HYDROXY (VIT D DEFICIENCY, FRACTURES): Vit D, 25-Hydroxy: 100 ng/mL (ref 30.0–100.0)

## 2023-07-05 NOTE — Progress Notes (Signed)
 Labs look GREAT! Vitamin D looks amazing!!

## 2023-07-12 ENCOUNTER — Other Ambulatory Visit: Payer: Self-pay | Admitting: Physician Assistant

## 2023-07-12 DIAGNOSIS — Z0000 Encounter for general adult medical examination without abnormal findings: Secondary | ICD-10-CM

## 2023-08-29 ENCOUNTER — Ambulatory Visit (INDEPENDENT_AMBULATORY_CARE_PROVIDER_SITE_OTHER)

## 2023-08-29 VITALS — Ht 65.0 in | Wt 200.0 lb

## 2023-08-29 DIAGNOSIS — Z0000 Encounter for general adult medical examination without abnormal findings: Secondary | ICD-10-CM

## 2023-08-29 NOTE — Progress Notes (Signed)
 Subjective:   Connie Cardenas is a 73 y.o. female who presents for Medicare Annual (Subsequent) preventive examination.  Visit Complete: Virtual I connected with  [name] on 08/29/23 by a audio enabled telemedicine application and verified that I am speaking with the correct person using two identifiers.  Patient Location: Home  Provider Location: Office/Clinic  I discussed the limitations of evaluation and management by telemedicine. The patient expressed understanding and agreed to proceed.  Vital Signs: Because this visit was a virtual/telehealth visit, some criteria may be missing or patient reported. Any vitals not documented were not able to be obtained and vitals that have been documented are patient reported.  Patient Medicare AWV questionnaire was completed by the patient on n/a; I have confirmed that all information answered by patient is correct and no changes since this date.  Cardiac Risk Factors include: advanced age (>62men, >81 women);diabetes mellitus;obesity (BMI >30kg/m2);dyslipidemia;hypertension     Objective:    Today's Vitals   08/29/23 1547  Weight: 200 lb (90.7 kg)  Height: 5\' 5"  (1.651 m)   Body mass index is 33.28 kg/m.     08/29/2023    3:58 PM 08/21/2022    1:59 PM 10/04/2015    1:28 PM  Advanced Directives  Does Patient Have a Medical Advance Directive? No No No  Would patient like information on creating a medical advance directive? No - Patient declined No - Patient declined Yes - Educational materials given    Current Medications (verified) Outpatient Encounter Medications as of 08/29/2023  Medication Sig   AMBULATORY NON FORMULARY MEDICATION Glucometer lancets and test strips for DM to check 1-2 times a day.   Ascorbic Acid (VITAMIN C PO) Take by mouth.   aspirin 81 MG tablet Take 81 mg by mouth daily.   diclofenac  Sodium (VOLTAREN ) 1 % GEL Apply 4 g topically 4 (four) times daily. To affected joint.   Ferrous Sulfate  (IRON PO) Take 1 tablet by mouth daily.   lisinopril  (ZESTRIL ) 10 MG tablet Take 1 tablet (10 mg total) by mouth daily.   meloxicam  (MOBIC ) 15 MG tablet Take 1 tablet (15 mg total) by mouth daily.   metFORMIN  (GLUCOPHAGE ) 1000 MG tablet Take 1 tablet (1,000 mg total) by mouth 2 (two) times daily with a meal.   pioglitazone  (ACTOS ) 45 MG tablet TAKE 1 TABLET(45 MG) BY MOUTH DAILY   rosuvastatin  (CRESTOR ) 20 MG tablet Take 1 tablet by mouth three times a week (Patient taking differently: Take 20 mg by mouth every other day. Take 1 tablet by mouth three times a week)   Cholecalciferol (VITAMIN D  PO) Take by mouth. (Patient not taking: Reported on 08/29/2023)   Semaglutide  (RYBELSUS ) 3 MG TABS Take 1 tablet (3 mg total) by mouth daily. (Patient not taking: Reported on 08/29/2023)   Semaglutide  (RYBELSUS ) 7 MG TABS Take 1 tablet (7 mg total) by mouth daily. (Patient not taking: Reported on 08/29/2023)   No facility-administered encounter medications on file as of 08/29/2023.    Allergies (verified) Farxiga  [dapagliflozin ] and Penicillins   History: Past Medical History:  Diagnosis Date   Carotid artery occlusion    Diabetes mellitus without complication (HCC)    Hypertension    Past Surgical History:  Procedure Laterality Date   TUBAL LIGATION     Family History  Problem Relation Age of Onset   Cancer Mother        colon   Hypertension Mother    Heart attack Father  Hyperlipidemia Father    Cancer Sister        non-hodkins lymphoma   Stroke Paternal Grandmother    Social History   Socioeconomic History   Marital status: Married    Spouse name: Anne Barrios   Number of children: 5   Years of education: 16   Highest education level: Bachelor's degree (e.g., BA, AB, BS)  Occupational History   Occupation: Retired  Tobacco Use   Smoking status: Never   Smokeless tobacco: Never  Vaping Use   Vaping status: Never Used  Substance and Sexual Activity   Alcohol use: No     Alcohol/week: 0.0 standard drinks of alcohol   Drug use: No   Sexual activity: Not Currently    Birth control/protection: Surgical  Other Topics Concern   Not on file  Social History Narrative   Lives with her spouse. She has five children. She enjoys crafting and reading.   Social Drivers of Corporate investment banker Strain: Low Risk  (08/29/2023)   Overall Financial Resource Strain (CARDIA)    Difficulty of Paying Living Expenses: Not hard at all  Food Insecurity: No Food Insecurity (08/29/2023)   Hunger Vital Sign    Worried About Running Out of Food in the Last Year: Never true    Ran Out of Food in the Last Year: Never true  Transportation Needs: No Transportation Needs (08/29/2023)   PRAPARE - Administrator, Civil Service (Medical): No    Lack of Transportation (Non-Medical): No  Physical Activity: Inactive (08/29/2023)   Exercise Vital Sign    Days of Exercise per Week: 0 days    Minutes of Exercise per Session: 0 min  Stress: No Stress Concern Present (08/29/2023)   [employer] of Occupational Health - Occupational Stress Questionnaire    Feeling of Stress : Not at all  Social Connections: Socially Integrated (08/29/2023)   Social Connection and Isolation Panel [NHANES]    Frequency of Communication with Friends and Family: More than three times a week    Frequency of Social Gatherings with Friends and Family: More than three times a week    Attends Religious Services: More than 4 times per year    Active Member of [employer] or Organizations: Yes    Attends Engineer, structural: More than 4 times per year    Marital Status: Married    Tobacco Counseling Counseling given: Not Answered   Clinical Intake:  Pre-visit preparation completed: Yes        BMI - recorded: 33.28 Nutritional Status: BMI > 30  Obese Nutritional Risks: None Diabetes: Yes CBG done?: No Did pt. bring in CBG monitor from home?: No  How often do you need to have someone  help you when you read instructions, pamphlets, or other written materials from your doctor or pharmacy?: 1 - Never What is the last grade level you completed in school?: 16  Interpreter Needed?: No      Activities of Daily Living    08/29/2023    3:50 PM  In your present state of health, do you have any difficulty performing the following activities:  Hearing? 0  Vision? 0  Difficulty concentrating or making decisions? 0  Walking or climbing stairs? 0  Doing errands, shopping? 0  Preparing Food and eating ? N  Using the Toilet? N  In the past six months, have you accidently leaked urine? Y  Do you have problems with loss of bowel control? N  Managing  your Medications? N  Managing your Finances? N  Housekeeping or managing your Housekeeping? N    Patient Care Team: [name], [name], PA-C as PCP - General (Family Medicine) Noureddine, Nizar D, MD as Referring Physician (Cardiology) Farrel Hones, MD as Referring Physician (Internal Medicine)  Indicate any recent Medical Services you may have received from other than [facility] providers in the past year (date may be approximate).     Assessment:   This is a routine wellness examination for Zamyriah.  Hearing/Vision screen No results found.   Goals Addressed             This Visit's Progress    Patient Stated       Patient stated she would like to walk more and lose weight.       Depression Screen    08/29/2023    3:57 PM 07/02/2023    8:55 AM 04/03/2023    7:45 AM 09/26/2022    7:55 AM 08/21/2022    1:59 PM 04/04/2022    7:06 AM 01/03/2022    7:22 AM  PHQ 2/9 Scores  PHQ - 2 Score 0 0 0 0 0 0 0    Fall Risk    08/29/2023    3:58 PM 07/02/2023    8:55 AM 04/03/2023    7:45 AM 09/26/2022    7:55 AM 08/21/2022    1:59 PM  Fall Risk   Falls in the past year? 1 0 0 0 0  Number falls in past yr: 1 0  0 0  Injury with Fall? 0 0  0 0  Risk for fall due to : No Fall Risks No Fall Risks   No Fall Risks  Follow up  Falls evaluation completed Falls evaluation completed   Falls evaluation completed    MEDICARE RISK AT HOME: Medicare Risk at Home Any stairs in or around the home?: No Home free of loose throw rugs in walkways, pet beds, electrical cords, etc?: Yes Adequate lighting in your home to reduce risk of falls?: No Life alert?: No Use of a cane, walker or w/c?: No Grab bars in the bathroom?: No Shower chair or bench in shower?: Yes Elevated toilet seat or a handicapped toilet?: Yes  TIMED UP AND GO:  Was the test performed?  No    Cognitive Function:        08/29/2023    4:01 PM 08/21/2022    2:06 PM  6CIT Screen  What Year? 0 points 0 points  What month? 0 points 0 points  What time? 0 points 0 points  Count back from 20 0 points 0 points  Months in reverse 0 points 0 points  Repeat phrase 0 points 0 points  Total Score 0 points 0 points    Immunizations Immunization History  Administered Date(s) Administered   PNEUMOCOCCAL CONJUGATE-20 07/04/2021   Tdap 06/21/2021    TDAP status: Up to date  Flu Vaccine status: Declined, Education has been provided regarding the importance of this vaccine but patient still declined. Advised may receive this vaccine at local pharmacy or Health Dept. Aware to provide a copy of the vaccination record if obtained from local pharmacy or Health Dept. Verbalized acceptance and understanding.  Pneumococcal vaccine status: Up to date  Covid-19 vaccine status: Declined, Education has been provided regarding the importance of this vaccine but patient still declined. Advised may receive this vaccine at local pharmacy or Health Dept.or vaccine clinic. Aware to provide a copy of the vaccination  record if obtained from local pharmacy or Health Dept. Verbalized acceptance and understanding.  Qualifies for Shingles Vaccine? Yes   Zostavax completed No   Shingrix Completed?: No.    Education has been provided regarding the importance of this vaccine.  Patient has been advised to call insurance company to determine out of pocket expense if they have not yet received this vaccine. Advised may also receive vaccine at local pharmacy or Health Dept. Verbalized acceptance and understanding.  Screening Tests Health Maintenance  Topic Date Due   Zoster Vaccines- Shingrix (1 of 2) Never done   OPHTHALMOLOGY EXAM  05/08/2023   Diabetic kidney evaluation - Urine ACR  09/26/2023   FOOT EXAM  09/26/2023   INFLUENZA VACCINE  11/22/2023   HEMOGLOBIN A1C  01/02/2024   Diabetic kidney evaluation - eGFR measurement  07/01/2024   Medicare Annual Wellness (AWV)  08/28/2024   MAMMOGRAM  10/03/2024   Colonoscopy  06/27/2027   DTaP/Tdap/Td (2 - Td or Tdap) 06/22/2031   Pneumonia Vaccine 60+ Years old  Completed   DEXA SCAN  Completed   Hepatitis C Screening  Completed   HPV VACCINES  Aged Out   Meningococcal B Vaccine  Aged Out   COVID-19 Vaccine  Discontinued    Health Maintenance  Health Maintenance Due  Topic Date Due   Zoster Vaccines- Shingrix (1 of 2) Never done   OPHTHALMOLOGY EXAM  05/08/2023   Diabetic kidney evaluation - Urine ACR  09/26/2023    Colorectal cancer screening: Type of screening: Colonoscopy. Completed 06/26/2017. Repeat every 10 years  Mammogram status: Completed 09/26/2022. Repeat every year  Bone Density status: Ordered 08/29/2023. Pt provided with contact info and advised to call to schedule appt.  Lung Cancer Screening: (Low Dose CT Chest recommended if Age 55-80 years, 20 pack-year currently smoking OR have quit w/in 15years.) does not qualify.   Lung Cancer Screening Referral: n/a  Additional Screening:  Hepatitis C Screening: does qualify; Completed 10/202021  Vision Screening: Recommended annual ophthalmology exams for early detection of glaucoma and other disorders of the eye. Is the patient up to date with their annual eye exam?  Yes  Who is the provider or what is the name of the office in which the patient  attends annual eye exams? Dr Jeffrey Mini  If pt is not established with a provider, would they like to be referred to a provider to establish care? N/a.   Dental Screening: Recommended annual dental exams for proper oral hygiene  Diabetic Foot Exam: Diabetic Foot Exam: Completed 09/26/2022  Community Resource Referral / Chronic Care Management: CRR required this visit?  No   CCM required this visit?  No     Plan:     I have personally reviewed and noted the following in the patient's chart:   Medical and social history Use of alcohol, tobacco or illicit drugs  Current medications and supplements including opioid prescriptions. Patient is not currently taking opioid prescriptions. Functional ability and status Nutritional status Physical activity Advanced directives List of other physicians Hospitalizations # 1, surgeries # 1, and ER # 0 visits in previous 12 months Vitals Screenings to include cognitive, depression, and falls Referrals and appointments  In addition, I have reviewed and discussed with patient certain preventive protocols, quality metrics, and best practice recommendations. A written personalized care plan for preventive services as well as general preventive health recommendations were provided to patient.     Aubrey Leaf, [place]   08/29/2023   After Visit Summary: (  MyChart) Due to this being a telephonic visit, the after visit summary with patients personalized plan was offered to patient via MyChart   Nurse Notes:    Righteous Bouchie is a 73 y.o. female patient of [name], [name], PA-C who had a [employer] Visit today via telephone. Iya is Retired and lives with their spouse. She has 5 children. She reports that she is socially active and does interact with friends/family regularly. She is moderately physically active and enjoys crafting and reading.

## 2023-08-29 NOTE — Patient Instructions (Signed)
  Connie Cardenas , Thank you for taking time to come for your Medicare Wellness Visit. I appreciate your ongoing commitment to your health goals. Please review the following plan we discussed and let me know if I can assist you in the future.   These are the goals we discussed:  Goals       Medication Management      Patient Goals/Self-Care Activities Over the next 180 days, patient will:  take medications as prescribed and check glucose daily, document, and provide at future appointments  Follow Up Plan: Telephone follow up appointment with care management team member scheduled for:  6 months      Patient Stated (pt-stated)      Patient stated that she would like to be more active and do more walking.      Patient Stated      Patient stated she would like to walk more and lose weight.         This is a list of the screening recommended for you and due dates:  Health Maintenance  Topic Date Due   Zoster (Shingles) Vaccine (1 of 2) Never done   Eye exam for diabetics  05/08/2023   Yearly kidney health urinalysis for diabetes  09/26/2023   Complete foot exam   09/26/2023   Flu Shot  11/22/2023   Hemoglobin A1C  01/02/2024   Yearly kidney function blood test for diabetes  07/01/2024   Medicare Annual Wellness Visit  08/28/2024   Mammogram  10/03/2024   Colon Cancer Screening  06/27/2027   DTaP/Tdap/Td vaccine (2 - Td or Tdap) 06/22/2031   Pneumonia Vaccine  Completed   DEXA scan (bone density measurement)  Completed   Hepatitis C Screening  Completed   HPV Vaccine  Aged Out   Meningitis B Vaccine  Aged Out   COVID-19 Vaccine  Discontinued

## 2023-09-06 ENCOUNTER — Other Ambulatory Visit: Payer: Self-pay | Admitting: Physician Assistant

## 2023-09-06 DIAGNOSIS — E119 Type 2 diabetes mellitus without complications: Secondary | ICD-10-CM

## 2023-09-06 DIAGNOSIS — I10 Essential (primary) hypertension: Secondary | ICD-10-CM

## 2023-09-30 ENCOUNTER — Other Ambulatory Visit: Payer: Self-pay | Admitting: Physician Assistant

## 2023-09-30 DIAGNOSIS — E1169 Type 2 diabetes mellitus with other specified complication: Secondary | ICD-10-CM

## 2023-10-02 ENCOUNTER — Encounter: Payer: Self-pay | Admitting: Physician Assistant

## 2023-10-02 ENCOUNTER — Ambulatory Visit: Admitting: Physician Assistant

## 2023-10-02 VITALS — BP 134/81 | HR 77 | Ht 65.0 in | Wt 203.0 lb

## 2023-10-02 DIAGNOSIS — I2583 Coronary atherosclerosis due to lipid rich plaque: Secondary | ICD-10-CM

## 2023-10-02 DIAGNOSIS — E66811 Obesity, class 1: Secondary | ICD-10-CM

## 2023-10-02 DIAGNOSIS — Z955 Presence of coronary angioplasty implant and graft: Secondary | ICD-10-CM

## 2023-10-02 DIAGNOSIS — Z6833 Body mass index (BMI) 33.0-33.9, adult: Secondary | ICD-10-CM

## 2023-10-02 DIAGNOSIS — E1165 Type 2 diabetes mellitus with hyperglycemia: Secondary | ICD-10-CM | POA: Diagnosis not present

## 2023-10-02 DIAGNOSIS — M654 Radial styloid tenosynovitis [de Quervain]: Secondary | ICD-10-CM

## 2023-10-02 DIAGNOSIS — E785 Hyperlipidemia, unspecified: Secondary | ICD-10-CM

## 2023-10-02 DIAGNOSIS — E6609 Other obesity due to excess calories: Secondary | ICD-10-CM

## 2023-10-02 DIAGNOSIS — I2510 Atherosclerotic heart disease of native coronary artery without angina pectoris: Secondary | ICD-10-CM | POA: Diagnosis not present

## 2023-10-02 DIAGNOSIS — E1169 Type 2 diabetes mellitus with other specified complication: Secondary | ICD-10-CM

## 2023-10-02 DIAGNOSIS — Z7984 Long term (current) use of oral hypoglycemic drugs: Secondary | ICD-10-CM

## 2023-10-02 DIAGNOSIS — I350 Nonrheumatic aortic (valve) stenosis: Secondary | ICD-10-CM | POA: Diagnosis not present

## 2023-10-02 LAB — POCT GLYCOSYLATED HEMOGLOBIN (HGB A1C): Hemoglobin A1C: 7.5 % — AB (ref 4.0–5.6)

## 2023-10-02 MED ORDER — METFORMIN HCL 1000 MG PO TABS: 1000.0000 mg | ORAL_TABLET | Freq: Two times a day (BID) | ORAL | 1 refills | Status: DC

## 2023-10-02 MED ORDER — MELOXICAM 15 MG PO TABS: 15.0000 mg | ORAL_TABLET | Freq: Every day | ORAL | 1 refills | Status: AC

## 2023-10-02 MED ORDER — ROSUVASTATIN CALCIUM 20 MG PO TABS: 20.0000 mg | ORAL_TABLET | ORAL | 3 refills | Status: AC

## 2023-10-02 MED ORDER — GLIPIZIDE 5 MG PO TABS: 5.0000 mg | ORAL_TABLET | Freq: Two times a day (BID) | ORAL | 2 refills | Status: DC

## 2023-10-02 NOTE — Patient Instructions (Signed)
 Start glipizide if cannot get rybelsus   Diabetes Mellitus and Nutrition, Adult When you have diabetes, or diabetes mellitus, it is very important to have healthy eating habits because your blood sugar (glucose) levels are greatly affected by what you eat and drink. Eating healthy foods in the right amounts, at about the same times every day, can help you: Manage your blood glucose. Lower your risk of heart disease. Improve your blood pressure. Reach or maintain a healthy weight. What can affect my meal plan? Every person with diabetes is different, and each person has different needs for a meal plan. Your health care provider may recommend that you work with a dietitian to make a meal plan that is best for you. Your meal plan may vary depending on factors such as: The calories you need. The medicines you take. Your weight. Your blood glucose, blood pressure, and cholesterol levels. Your activity level. Other health conditions you have, such as heart or kidney disease. How do carbohydrates affect me? Carbohydrates, also called carbs, affect your blood glucose level more than any other type of food. Eating carbs raises the amount of glucose in your blood. It is important to know how many carbs you can safely have in each meal. This is different for every person. Your dietitian can help you calculate how many carbs you should have at each meal and for each snack. How does alcohol affect me? Alcohol can cause a decrease in blood glucose (hypoglycemia), especially if you use insulin or take certain diabetes medicines by mouth. Hypoglycemia can be a life-threatening condition. Symptoms of hypoglycemia, such as sleepiness, dizziness, and confusion, are similar to symptoms of having too much alcohol. Do not drink alcohol if: Your health care provider tells you not to drink. You are pregnant, may be pregnant, or are planning to become pregnant. If you drink alcohol: Limit how much you have to: 0-1  drink a day for women. 0-2 drinks a day for men. Know how much alcohol is in your drink. In the [place] one drink equals one 12 oz bottle of beer (355 mL), one 5 oz glass of wine (148 mL), or one 1 oz glass of hard liquor (44 mL). Keep yourself hydrated with water, diet soda, or unsweetened iced tea. Keep in mind that regular soda, juice, and other mixers may contain a lot of sugar and must be counted as carbs. What are tips for following this plan?  Reading food labels Start by checking the serving size on the Nutrition Facts label of packaged foods and drinks. The number of calories and the amount of carbs, fats, and other nutrients listed on the label are based on one serving of the item. Many items contain more than one serving per package. Check the total grams (g) of carbs in one serving. Check the number of grams of saturated fats and trans fats in one serving. Choose foods that have a low amount or none of these fats. Check the number of milligrams (mg) of salt (sodium) in one serving. Most people should limit total sodium intake to less than 2,300 mg per day. Always check the nutrition information of foods labeled as low-fat or nonfat. These foods may be higher in added sugar or refined carbs and should be avoided. Talk to your dietitian to identify your daily goals for nutrients listed on the label. Shopping Avoid buying canned, pre-made, or processed foods. These foods tend to be high in fat, sodium, and added sugar. Shop around the outside edge of  the grocery store. This is where you will most often find fresh fruits and vegetables, bulk grains, fresh meats, and fresh dairy products. Cooking Use low-heat cooking methods, such as baking, instead of high-heat cooking methods, such as deep frying. Cook using healthy oils, such as olive, canola, or sunflower oil. Avoid cooking with butter, cream, or high-fat meats. Meal planning Eat meals and snacks regularly, preferably at the same  times every day. Avoid going long periods of time without eating. Eat foods that are high in fiber, such as fresh fruits, vegetables, beans, and whole grains. Eat 4-6 oz (112-168 g) of lean protein each day, such as lean meat, chicken, fish, eggs, or tofu. One ounce (oz) (28 g) of lean protein is equal to: 1 oz (28 g) of meat, chicken, or fish. 1 egg.  cup (62 g) of tofu. Eat some foods each day that contain healthy fats, such as avocado, nuts, seeds, and fish. What foods should I eat? Fruits Berries. Apples. Oranges. Peaches. Apricots. Plums. Grapes. Mangoes. Papayas. Pomegranates. Kiwi. Cherries. Vegetables Leafy greens, including lettuce, spinach, kale, chard, collard greens, mustard greens, and cabbage. Beets. Cauliflower. Broccoli. Carrots. Green beans. Tomatoes. Peppers. Onions. Cucumbers. Brussels sprouts. Grains Whole grains, such as whole-wheat or whole-grain bread, crackers, tortillas, cereal, and pasta. Unsweetened oatmeal. Quinoa. Brown or wild rice. Meats and other proteins Seafood. Poultry without skin. Lean cuts of poultry and beef. Tofu. Nuts. Seeds. Dairy Low-fat or fat-free dairy products such as milk, yogurt, and cheese. The items listed above may not be a complete list of foods and beverages you can eat and drink. Contact a dietitian for more information. What foods should I avoid? Fruits Fruits canned with syrup. Vegetables Canned vegetables. Frozen vegetables with butter or cream sauce. Grains Refined white flour and flour products such as bread, pasta, snack foods, and cereals. Avoid all processed foods. Meats and other proteins Fatty cuts of meat. Poultry with skin. Breaded or fried meats. Processed meat. Avoid saturated fats. Dairy Full-fat yogurt, cheese, or milk. Beverages Sweetened drinks, such as soda or iced tea. The items listed above may not be a complete list of foods and beverages you should avoid. Contact a dietitian for more information. Questions  to ask a health care provider Do I need to meet with a certified diabetes care and education specialist? Do I need to meet with a dietitian? What number can I call if I have questions? When are the best times to check my blood glucose? Where to find more information: American Diabetes Association: diabetes.org Academy of Nutrition and Dietetics: eatright.[employer] of Diabetes and Digestive and Kidney Diseases: StageSync.si Association of Diabetes Care & Education Specialists: diabeteseducator.org Summary It is important to have healthy eating habits because your blood sugar (glucose) levels are greatly affected by what you eat and drink. It is important to use alcohol carefully. A healthy meal plan will help you manage your blood glucose and lower your risk of heart disease. Your health care provider may recommend that you work with a dietitian to make a meal plan that is best for you. This information is not intended to replace advice given to you by your health care provider. Make sure you discuss any questions you have with your health care provider. Document Revised: 11/10/2019 Document Reviewed: 11/11/2019 Elsevier Patient Education  2024 [employer].

## 2023-10-02 NOTE — Progress Notes (Signed)
 Established Patient Office Visit  Subjective   Patient ID: Connie Cardenas, female    DOB: 05-12-1950  Age: 73 y.o. MRN: [id number]  Chief Complaint  Patient presents with   Medical Management of Chronic Issues    tdm    HPI Pt is a 73 yo obese female with T2DM, HTN, CAD, HLD who presents to the clinic for follow up and medication refills.   Pt has had 2 stents placed and on ASA, plavix and metoprolol. Connie Cardenas is doing well. No concerns. Connie Cardenas sees cardiology for management. Her chest tightness with exertion is getting better.   Pt admits Connie Cardenas is not taking rybelsus  because of cost. Connie Cardenas is supposed to go to [place] in August and hopes to get some to bring back that is affordable. Connie Cardenas is not exercising much. Connie Cardenas is checking her sugars sometimes and running up to 200 in the mornings.   Connie Cardenas continues to have right wrist pain. Connie Cardenas is not taking mobic  right now but it does help. Connie Cardenas has not gone back to see Dr. Elva Hamburger.   .. Active Ambulatory Problems    Diagnosis Date Noted   Dyslipidemia, goal LDL below 70 10/04/2015   Diabetes mellitus (HCC) 10/04/2015   Essential hypertension, benign 10/04/2015   Class 1 obesity due to excess calories with serious comorbidity and body mass index (BMI) of 31.0 to 31.9 in adult 01/04/2016   Hyperlipidemia associated with type 2 diabetes mellitus (HCC) 04/14/2016   Elevated liver enzymes 07/15/2017   Systolic ejection murmur 07/15/2017   Left carotid bruit 07/15/2017   Liver cyst 04/08/2018   Breast cyst, left 04/11/2018   Moderate aortic valve stenosis 04/11/2018   Bicuspid aortic valve 04/11/2018   Carotid artery stenosis, asymptomatic, right 04/11/2018   Swelling of left index finger 06/11/2018   History of COVID-19 05/05/2019   Chronic right shoulder pain 08/03/2019   IDA (iron deficiency anemia) 11/13/2019   Type II diabetes mellitus with complication, uncontrolled 06/01/2020   Low serum iron 03/20/2021   Asymptomatic varicose veins of  right lower extremity 03/20/2021   Foot callus 03/20/2021   De Quervain's tenosynovitis, right 05/01/2023   No energy 07/02/2023   SOB (shortness of breath) 07/02/2023   Post-menopausal 07/02/2023   Vitamin D  deficiency 07/02/2023   Lipedema 07/03/2023   Coronary artery disease due to lipid rich plaque 10/02/2023   S/P coronary artery stent placement 10/02/2023   Uncontrolled type 2 diabetes mellitus with hyperglycemia (HCC) 10/02/2023   Resolved Ambulatory Problems    Diagnosis Date Noted   COVID-19 04/22/2019   Past Medical History:  Diagnosis Date   Carotid artery occlusion    Diabetes mellitus without complication (HCC)    Hypertension      ROS See HPI.    Objective:      BP 134/81   Pulse 77   Ht 5' 5 (1.651 m)   Wt 203 lb (92.1 kg)   SpO2 99%   BMI 33.78 kg/m  BP Readings from Last 3 Encounters:  10/02/23 134/81  07/02/23 136/77  05/01/23 128/62   Wt Readings from Last 3 Encounters:  10/02/23 203 lb (92.1 kg)  08/29/23 200 lb (90.7 kg)  07/02/23 202 lb 8 oz (91.9 kg)      Physical Exam Constitutional:      Appearance: Normal appearance. Connie Cardenas is obese.  HENT:     Head: Normocephalic.  Neck:     Vascular: Carotid bruit present.   Cardiovascular:     Rate and  Rhythm: Normal rate and regular rhythm.     Heart sounds: Murmur heard.     Comments: 5/6 SEM with radiation to both carotids.  Pulmonary:     Effort: Pulmonary effort is normal.     Breath sounds: Normal breath sounds.   Musculoskeletal:     Cervical back: Normal range of motion and neck supple. No tenderness.     Right lower leg: No edema.     Left lower leg: No edema.  Lymphadenopathy:     Cervical: No cervical adenopathy.   Neurological:     General: No focal deficit present.     Mental Status: Connie Cardenas is alert and oriented to person, place, and time.   Psychiatric:        Mood and Affect: Mood normal.      Results for orders placed or performed in visit on 10/02/23  POCT HgB  A1C  Result Value Ref Range   Hemoglobin A1C 7.5 (A) 4.0 - 5.6 %   HbA1c POC (<> result, manual entry)     HbA1c, POC (prediabetic range)     HbA1c, POC (controlled diabetic range)      The 10-year ASCVD risk score (Arnett DK, et al., 2019) is: 22.5%    Assessment & Plan:  Connie Cardenas was seen today for medical management of chronic issues.  Diagnoses and all orders for this visit:  Uncontrolled type 2 diabetes mellitus with hyperglycemia (HCC) -     POCT HgB A1C -     metFORMIN  (GLUCOPHAGE ) 1000 MG tablet; Take 1 tablet (1,000 mg total) by mouth 2 (two) times daily with a meal. -     glipiZIDE  (GLUCOTROL ) 5 MG tablet; Take 1 tablet (5 mg total) by mouth 2 (two) times daily before a meal.  Coronary artery disease due to lipid rich plaque  S/P coronary artery stent placement  De Quervain's tenosynovitis, right -     meloxicam  (MOBIC ) 15 MG tablet; Take 1 tablet (15 mg total) by mouth daily.  Moderate aortic valve stenosis  Dyslipidemia, goal LDL below 70 -     rosuvastatin  (CRESTOR ) 20 MG tablet; Take 1 tablet (20 mg total) by mouth every other day. Take 1 tablet by mouth three times a week  Class 1 obesity due to excess calories with serious comorbidity and body mass index (BMI) of 33.0 to 33.9 in adult   A1C not to goal under 7 Continue metformin  Added glipizide , discussed SE and how to take On statin as Connie Cardenas can tolerate BP very close to goal Managed by cardiology Needs eye exam  Declined vaccines Follow up in 3 months  Refilled mobic  for de quervains right wrist Follow up with Dr. Elva Hamburger to consider injection   Ryliegh Mcduffey, PA-C

## 2023-10-04 ENCOUNTER — Encounter: Payer: Self-pay | Admitting: Physician Assistant

## 2023-10-07 ENCOUNTER — Ambulatory Visit
Admission: RE | Admit: 2023-10-07 | Discharge: 2023-10-07 | Disposition: A | Discharge status: Home / Self Care | Source: Ambulatory Visit | Attending: Physician Assistant | Admitting: Physician Assistant

## 2023-10-07 DIAGNOSIS — Z0000 Encounter for general adult medical examination without abnormal findings: Secondary | ICD-10-CM

## 2023-10-09 ENCOUNTER — Ambulatory Visit: Payer: Self-pay | Admitting: Physician Assistant

## 2023-10-09 NOTE — Progress Notes (Signed)
 Normal mammogram. Follow up in 1 year.

## 2023-11-04 ENCOUNTER — Ambulatory Visit: Payer: Self-pay

## 2023-11-04 NOTE — Telephone Encounter (Signed)
  FYI Only or Action Required?: Action required by provider: clinical question for provider and update on patient condition.  Patient was last seen in primary care on 10/02/2023 by Antoniette Vermell CROME, PA-C.  Called Nurse Triage reporting Medication Problem.  Symptoms began today.  Interventions attempted: Rest, hydration, or home remedies.  Symptoms are: gradually improving.  Triage Disposition: Call PCP Now  Patient/caregiver understands and will follow disposition?: Yes     Copied from CRM [id number]. Topic: Clinical - Red Word Triage >> Nov 04, 2023 12:37 PM Susanna ORN wrote: Red Word that prompted transfer to Nurse Triage: Patient states Dr. Antoniette gave her a new prescription for Glipizide . Patient states that it drops her blood sugar pretty fast even when she's taking it with a meal. Patient states her blood sugar was high today, reading 198 this morning after she ate breakfast. She's trying to decide whether to keep taking it or not. States she took it again along with metformin  & it was 199. Waited an hour later, took it again and she started feeling shaky & it was down to 99 & then dropped to 88 at lunch time. Reason for Disposition  [1] Caller has URGENT medicine question about med that primary care doctor (or NP/PA) or specialist prescribed AND [2] triager unable to answer question  Answer Assessment - Initial Assessment Questions 1. NAME of MEDICINE: What medicine(s) are you calling about?     Glipizide  2. QUESTION: What is your question? (e.g., double dose of medicine, side effect)     Dose and side effects 3. PRESCRIBER: Who prescribed the medicine? Reason: if prescribed by specialist, call should be referred to that group.     Jade [name] 4. SYMPTOMS: Do you have any symptoms? If Yes, ask: What symptoms are you having?  How bad are the symptoms (e.g., mild, moderate, severe)     Lowering BG too fast, shaking  Protocols used: Medication Question Call-A-AH

## 2023-11-04 NOTE — Telephone Encounter (Signed)
 Pt states that she thinks the Glipizide  is lowering her BG too fast. States her BG was 198 before breakfast and then she after taking it and her Metformin  and eating breakfast then her BG was 99 and then went down to 88.  States that she just ate lunch and is feeling less shaking and her BG is reading 103 just now. States just finished lunch about 15 minutes ago. Pt states that she has also been inconstant taking the medication but every time she takes it she experience this problem with her BG dropping like an hour after taking and eating.

## 2023-12-24 ENCOUNTER — Encounter: Payer: Self-pay | Admitting: Sports Medicine

## 2023-12-31 ENCOUNTER — Other Ambulatory Visit: Payer: Self-pay | Admitting: Physician Assistant

## 2023-12-31 DIAGNOSIS — E1165 Type 2 diabetes mellitus with hyperglycemia: Secondary | ICD-10-CM

## 2024-01-01 ENCOUNTER — Ambulatory Visit (INDEPENDENT_AMBULATORY_CARE_PROVIDER_SITE_OTHER): Admitting: Physician Assistant

## 2024-01-01 VITALS — BP 135/85 | HR 78 | Ht 65.0 in | Wt 208.0 lb

## 2024-01-01 DIAGNOSIS — R809 Proteinuria, unspecified: Secondary | ICD-10-CM

## 2024-01-01 DIAGNOSIS — E1165 Type 2 diabetes mellitus with hyperglycemia: Secondary | ICD-10-CM

## 2024-01-01 DIAGNOSIS — E785 Hyperlipidemia, unspecified: Secondary | ICD-10-CM

## 2024-01-01 DIAGNOSIS — Z955 Presence of coronary angioplasty implant and graft: Secondary | ICD-10-CM

## 2024-01-01 DIAGNOSIS — Z7984 Long term (current) use of oral hypoglycemic drugs: Secondary | ICD-10-CM

## 2024-01-01 DIAGNOSIS — E6609 Other obesity due to excess calories: Secondary | ICD-10-CM

## 2024-01-01 DIAGNOSIS — E1169 Type 2 diabetes mellitus with other specified complication: Secondary | ICD-10-CM

## 2024-01-01 DIAGNOSIS — E66811 Obesity, class 1: Secondary | ICD-10-CM

## 2024-01-01 DIAGNOSIS — Z6832 Body mass index (BMI) 32.0-32.9, adult: Secondary | ICD-10-CM

## 2024-01-01 LAB — POCT GLYCOSYLATED HEMOGLOBIN (HGB A1C): Hemoglobin A1C: 6.7 % — AB (ref 4.0–5.6)

## 2024-01-01 LAB — POCT UA - MICROALBUMIN
Creatinine, POC: 50 mg/dL
Microalbumin Ur, POC: 30 mg/L

## 2024-01-01 NOTE — Progress Notes (Unsigned)
   Established Patient Office Visit  Subjective   Patient ID: Connie Cardenas, female    DOB: [birth year]  Age: 73 y.o. MRN: [id number]  Chief Complaint  Patient presents with   Medical Management of Chronic Issues    Last A1c 7.5    HPI Patient is here for routine 3 month follow up for T2DM, HTN, HLD, CAD. She hasn't checked her blood sugar at home for the past 2 weeks but was frequently checking prior and says night time values ran around 130 and morning around 150. Started on Glipizide  at her last follow up in June and compliant on Metformin  and Pioglitazone . No complaints at this time. Denies any episodes of hypoglycemia, rash, vision changes, numbness or tingling.  She did have some ankle edema while traveling in August, but it was relieved with walking and exercise. She has been in cardiac rehab for CAD since having stents placed. It has been going well, no complaints at this time. Her cardiologist limited her exercise to twice a week but she hopes to start a chair yoga program soon.  {History (Optional):23778}  ROS    Objective:     BP 135/85   Pulse 78   Ht 5' 5 (1.651 m)   Wt 94.3 kg   SpO2 99%   BMI 34.61 kg/m  {Vitals History (Optional):23777}  Physical Exam   Results for orders placed or performed in visit on 01/01/24  POCT HgB A1C  Result Value Ref Range   Hemoglobin A1C 6.7 (A) 4.0 - 5.6 %   HbA1c POC (<> result, manual entry)     HbA1c, POC (prediabetic range)     HbA1c, POC (controlled diabetic range)    POCT UA - Microalbumin  Result Value Ref Range   Microalbumin Ur, POC 30 mg/L   Creatinine, POC 50 mg/dL   Albumin/Creatinine Ratio, Urine, POC 30-300     {Labs (Optional):23779}  The 10-year ASCVD risk score (Arnett DK, et al., 2019) is: 22.8%    Assessment & Plan:   Problem List Items Addressed This Visit       Endocrine   Uncontrolled type 2 diabetes mellitus with hyperglycemia (HCC) - Primary   Relevant Orders   POCT HgB A1C  (Completed)   POCT UA - Microalbumin (Completed)    Return in about 3 months (around 04/01/2024), or if symptoms worsen or fail to improve.    [address] [place], Student-PA

## 2024-01-02 ENCOUNTER — Encounter: Payer: Self-pay | Admitting: Physician Assistant

## 2024-01-02 DIAGNOSIS — R809 Proteinuria, unspecified: Secondary | ICD-10-CM | POA: Insufficient documentation

## 2024-01-02 LAB — CMP14+EGFR
ALT: 9 IU/L (ref 0–32)
AST: 23 IU/L (ref 0–40)
Albumin: 4.2 g/dL (ref 3.9–4.9)
Alkaline Phosphatase: 77 IU/L (ref 44–121)
BUN/Creatinine Ratio: 22 (ref 12–28)
BUN: 18 mg/dL (ref 8–27)
Bilirubin Total: 0.4 mg/dL (ref 0.0–1.2)
CO2: 18 mmol/L — ABNORMAL LOW (ref 20–29)
Calcium: 9.2 mg/dL (ref 8.7–10.3)
Chloride: 101 mmol/L (ref 96–106)
Creatinine, Ser: 0.81 mg/dL (ref 0.57–1.00)
Globulin, Total: 2.4 g/dL (ref 1.5–4.5)
Glucose: 139 mg/dL — ABNORMAL HIGH (ref 70–99)
Potassium: 4.5 mmol/L (ref 3.5–5.2)
Sodium: 137 mmol/L (ref 134–144)
Total Protein: 6.6 g/dL (ref 6.0–8.5)
eGFR: 78 mL/min/1.73 (ref >59)

## 2024-01-03 ENCOUNTER — Ambulatory Visit: Payer: Self-pay | Admitting: Physician Assistant

## 2024-01-03 NOTE — Progress Notes (Signed)
 Kidney function decreased a little but still above 60. Will continue to monitor. Overall labs look good.

## 2024-04-01 ENCOUNTER — Ambulatory Visit: Admitting: Physician Assistant

## 2024-04-01 ENCOUNTER — Encounter: Payer: Self-pay | Admitting: Physician Assistant

## 2024-04-01 VITALS — BP 136/60 | HR 62 | Ht 65.0 in | Wt 211.0 lb

## 2024-04-01 DIAGNOSIS — Z955 Presence of coronary angioplasty implant and graft: Secondary | ICD-10-CM

## 2024-04-01 DIAGNOSIS — I2583 Coronary atherosclerosis due to lipid rich plaque: Secondary | ICD-10-CM

## 2024-04-01 DIAGNOSIS — R809 Proteinuria, unspecified: Secondary | ICD-10-CM

## 2024-04-01 DIAGNOSIS — I2510 Atherosclerotic heart disease of native coronary artery without angina pectoris: Secondary | ICD-10-CM | POA: Diagnosis not present

## 2024-04-01 DIAGNOSIS — E1169 Type 2 diabetes mellitus with other specified complication: Secondary | ICD-10-CM | POA: Diagnosis not present

## 2024-04-01 DIAGNOSIS — I350 Nonrheumatic aortic (valve) stenosis: Secondary | ICD-10-CM | POA: Diagnosis not present

## 2024-04-01 DIAGNOSIS — E66811 Obesity, class 1: Secondary | ICD-10-CM

## 2024-04-01 DIAGNOSIS — E785 Hyperlipidemia, unspecified: Secondary | ICD-10-CM

## 2024-04-01 DIAGNOSIS — Z7984 Long term (current) use of oral hypoglycemic drugs: Secondary | ICD-10-CM

## 2024-04-01 DIAGNOSIS — E1165 Type 2 diabetes mellitus with hyperglycemia: Secondary | ICD-10-CM

## 2024-04-01 DIAGNOSIS — I10 Essential (primary) hypertension: Secondary | ICD-10-CM | POA: Diagnosis not present

## 2024-04-01 LAB — POCT GLYCOSYLATED HEMOGLOBIN (HGB A1C): Hemoglobin A1C: 6.2 % — AB (ref 4.0–5.6)

## 2024-04-01 MED ORDER — METFORMIN HCL 1000 MG PO TABS: 1000.0000 mg | ORAL_TABLET | Freq: Two times a day (BID) | ORAL | 1 refills | Status: AC

## 2024-04-01 MED ORDER — GLIPIZIDE 5 MG PO TABS: 5.0000 mg | ORAL_TABLET | Freq: Two times a day (BID) | ORAL | 1 refills | Status: AC

## 2024-04-01 NOTE — Progress Notes (Unsigned)
 Established Patient Office Visit  Subjective   Patient ID: Connie Cardenas, female    DOB: [birth year]  Age: 73 y.o. MRN: [id number]  Chief Complaint  Patient presents with   Medical Management of Chronic Issues    HPI .SABRADiscussed the use of AI scribe software for clinical note transcription with the patient, who gave verbal consent to proceed.  History of Present Illness Connie Cardenas is a 73 year old female with diabetes who presents for a three-month follow-up.  Glycemic control - Hemoglobin A1c is 6.2, improved from 6.7 three months ago - Attributes improvement to reducing frequency of eating out - Currently taking a statin - Declines all vaccines - not checking sugars at home.   Cardiovascular symptoms - Cold sweats, palpitations, and tremors led to emergency room visit on Thanksgiving - Chest discomfort with exertion, including going upstairs, walking uphill, or carrying objects - History of two coronary stents - Under observation for aortic valve replacement, monitored by cardiologist - Scheduled for echocardiogram, transesophageal echocardiogram (TEE), and heart catheterization  Weight management - Gradual weight increase over time, attributed to decreased physical activity - Desires to reduce weight to under 200 pounds  Lifestyle and dietary habits - Making healthier lifestyle choices with husband - Drinks water and 50/50 iced tea, possibly consuming more iced tea than intended    ROS See HPI.    Objective:     BP 136/60   Pulse 62   Ht 5' 5 (1.651 m)   Wt 211 lb (95.7 kg)   SpO2 99%   BMI 35.11 kg/m  BP Readings from Last 3 Encounters:  04/01/24 136/60  01/01/24 135/85  10/02/23 134/81   Wt Readings from Last 3 Encounters:  04/01/24 211 lb (95.7 kg)  01/01/24 208 lb (94.3 kg)  10/02/23 203 lb (92.1 kg)      Physical Exam Constitutional:      Appearance: Normal appearance. She is obese.  HENT:     Head:  Normocephalic.  Cardiovascular:     Rate and Rhythm: Normal rate and regular rhythm.     Heart sounds: Murmur heard.     Comments: 5/6 SEM with radiation to carotids.  Pulmonary:     Effort: Pulmonary effort is normal.     Breath sounds: Normal breath sounds.  Musculoskeletal:     Right lower leg: No edema.     Left lower leg: No edema.  Neurological:     General: No focal deficit present.     Mental Status: [name], place, and time.  Psychiatric:        Mood and Affect: Mood normal.      Results for orders placed or performed in visit on 04/01/24  POCT HgB A1C  Result Value Ref Range   Hemoglobin A1C 6.2 (A) 4.0 - 5.6 %   HbA1c POC (<> result, manual entry)     HbA1c, POC (prediabetic range)     HbA1c, POC (controlled diabetic range)       The 10-year ASCVD risk score (Arnett DK, et al., 2019) is: 23.6%    Assessment & Plan:  SABRASABRACharlisa Cardenas was seen today for medical management of chronic issues.  Diagnoses and all orders for this visit:  Hyperlipidemia associated with type 2 diabetes mellitus (HCC) -     POCT HgB A1C -     glipiZIDE  (GLUCOTROL ) 5 MG tablet; Take 1 tablet (5 mg total) by mouth 2 (two) times daily before  a meal. -     metFORMIN  (GLUCOPHAGE ) 1000 MG tablet; Take 1 tablet (1,000 mg total) by mouth 2 (two) times daily with a meal.  Microalbuminuria  Morbid obesity (HCC)  S/P coronary artery stent placement  Dyslipidemia, goal LDL below 70  Hypertension goal BP (blood pressure) < 130/80  Moderate aortic valve stenosis  Coronary artery disease due to lipid rich plaque   Assessment & Plan Type 2 diabetes mellitus with hyperlipidemia A1c improved from 6.7 to 6.2, indicating better glycemic control. Reduced eating out contributed to improved diabetes management. Currently on a statin for hyperlipidemia. - Continue current diabetes management plan with: Glipizide /metformin  - Continue statin therapy for  hyperlipidemia. - Schedule or locate eye exam - BP right at goal  Coronary artery disease with prior stent placement Scheduled for echocardiogram, TEE, and heart catheterization to evaluate cardiac function and potential need for additional stents. Recent ER visit due to chest pain and palpitations, possibly related to cardiac issues. Cardiologist monitoring aortic valve for potential replacement. - Proceed with scheduled echocardiogram, TEE, and heart catheterization. - Monitor for symptoms of cardiac distress and report any changes.  Aortic valve stenosis Cardiologist monitoring aortic valve for potential replacement. Symptoms may indicate worsening stenosis, such as difficulty with physical activity. - Continue monitoring aortic valve function with cardiologist. - Avoid strenuous physical activity until further evaluation.  Morbid obesity Weight has been increasing slightly over time. Difficulty exercising due to cardiac concerns. Goal is to reduce weight to under 200 pounds for better health outcomes. - Encouraged weight loss through dietary modifications and increased physical activity as tolerated. - Set goal to reduce weight to under 200 pounds.    Cleburne Savini, PA-C

## 2024-04-01 NOTE — Patient Instructions (Signed)
 Keep follow up with cardiology.  Stay on same medications.

## 2024-04-06 ENCOUNTER — Encounter: Payer: Self-pay | Admitting: Physician Assistant

## 2024-04-06 DIAGNOSIS — I10 Essential (primary) hypertension: Secondary | ICD-10-CM | POA: Insufficient documentation

## 2024-04-27 ENCOUNTER — Encounter: Payer: Self-pay | Admitting: Physician Assistant

## 2024-07-01 ENCOUNTER — Ambulatory Visit: Admitting: Physician Assistant

## 2024-09-01 ENCOUNTER — Ambulatory Visit
